# Patient Record
Sex: Male | Born: 1985 | Race: Black or African American | Hispanic: No | Marital: Single | Smoking: Current every day smoker
Health system: Southern US, Community
[De-identification: ages and names within clinical notes are randomized; demographics above are authoritative.]

## PROBLEM LIST (undated history)

## (undated) HISTORY — PX: MANDIBLE FRACTURE SURGERY: SHX706

---
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
## 2006-06-11 ENCOUNTER — Emergency Department (HOSPITAL_COMMUNITY): Admission: EM | Admit: 2006-06-11 | Discharge: 2006-06-11 | Payer: Self-pay | Admitting: Family Medicine

## 2007-07-08 ENCOUNTER — Emergency Department (HOSPITAL_COMMUNITY): Admission: EM | Admit: 2007-07-08 | Discharge: 2007-07-09 | Payer: Self-pay | Admitting: Emergency Medicine

## 2007-10-11 ENCOUNTER — Emergency Department (HOSPITAL_COMMUNITY): Admission: EM | Admit: 2007-10-11 | Discharge: 2007-10-11 | Payer: Self-pay | Admitting: Emergency Medicine

## 2007-10-23 ENCOUNTER — Emergency Department (HOSPITAL_COMMUNITY): Admission: EM | Admit: 2007-10-23 | Discharge: 2007-10-23 | Payer: Self-pay | Admitting: Emergency Medicine

## 2007-10-25 ENCOUNTER — Emergency Department (HOSPITAL_COMMUNITY): Admission: EM | Admit: 2007-10-25 | Discharge: 2007-10-26 | Payer: Self-pay | Admitting: Emergency Medicine

## 2007-10-26 ENCOUNTER — Emergency Department (HOSPITAL_COMMUNITY): Admission: EM | Admit: 2007-10-26 | Discharge: 2007-10-26 | Payer: Self-pay | Admitting: Emergency Medicine

## 2009-11-06 ENCOUNTER — Emergency Department (HOSPITAL_COMMUNITY): Admission: EM | Admit: 2009-11-06 | Discharge: 2009-11-06 | Payer: Self-pay | Admitting: Emergency Medicine

## 2009-12-06 ENCOUNTER — Ambulatory Visit (HOSPITAL_COMMUNITY): Admission: RE | Admit: 2009-12-06 | Discharge: 2009-12-06 | Payer: Self-pay | Admitting: Orthopaedic Surgery
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
## 2010-12-28 ENCOUNTER — Emergency Department (HOSPITAL_COMMUNITY): Payer: Self-pay

## 2010-12-28 ENCOUNTER — Emergency Department (HOSPITAL_COMMUNITY)
Admission: EM | Admit: 2010-12-28 | Discharge: 2010-12-28 | Disposition: A | Discharge status: Home / Self Care | Payer: Self-pay | Attending: Emergency Medicine | Admitting: Emergency Medicine

## 2010-12-28 DIAGNOSIS — M533 Sacrococcygeal disorders, not elsewhere classified: Secondary | ICD-10-CM | POA: Insufficient documentation

## 2010-12-28 DIAGNOSIS — S335XXA Sprain of ligaments of lumbar spine, initial encounter: Secondary | ICD-10-CM | POA: Insufficient documentation

## 2010-12-28 DIAGNOSIS — M546 Pain in thoracic spine: Secondary | ICD-10-CM | POA: Insufficient documentation

## 2010-12-28 DIAGNOSIS — M545 Low back pain, unspecified: Secondary | ICD-10-CM | POA: Insufficient documentation

## 2010-12-28 DIAGNOSIS — IMO0002 Reserved for concepts with insufficient information to code with codable children: Secondary | ICD-10-CM | POA: Insufficient documentation

## 2010-12-28 DIAGNOSIS — M542 Cervicalgia: Secondary | ICD-10-CM | POA: Insufficient documentation

## 2010-12-28 DIAGNOSIS — S139XXA Sprain of joints and ligaments of unspecified parts of neck, initial encounter: Secondary | ICD-10-CM | POA: Insufficient documentation

## 2010-12-28 DIAGNOSIS — W138XXA Fall from, out of or through other building or structure, initial encounter: Secondary | ICD-10-CM | POA: Insufficient documentation

## 2010-12-28 DIAGNOSIS — M25569 Pain in unspecified knee: Secondary | ICD-10-CM | POA: Insufficient documentation

## 2011-01-29 LAB — PROTIME-INR: INR: 1

## 2011-01-29 LAB — CBC
HCT: 43.7
Hemoglobin: 13.8
MCV: 72.3 — ABNORMAL LOW
Platelets: 245
WBC: 9

## 2011-01-29 LAB — POCT I-STAT CREATININE: Operator id: 285491

## 2011-01-29 LAB — I-STAT 8, (EC8 V) (CONVERTED LAB)
Acid-base deficit: 1
Chloride: 109
Glucose, Bld: 106 — ABNORMAL HIGH
Hemoglobin: 17
Operator id: 285491
Sodium: 142
TCO2: 26
pH, Ven: 7.356 — ABNORMAL HIGH

## 2011-02-01 LAB — BASIC METABOLIC PANEL
CO2: 27
Calcium: 9.7
Glucose, Bld: 89

## 2011-02-01 LAB — CBC
Hemoglobin: 14.5
MCHC: 32.2
RBC: 6.33 — ABNORMAL HIGH
RDW: 15.2

## 2011-02-01 LAB — PROTIME-INR: Prothrombin Time: 14

## 2011-02-01 LAB — D-DIMER, QUANTITATIVE: D-Dimer, Quant: <0.22

## 2014-10-02 ENCOUNTER — Encounter (HOSPITAL_COMMUNITY): Payer: Self-pay | Admitting: *Deleted

## 2014-10-02 ENCOUNTER — Emergency Department (INDEPENDENT_AMBULATORY_CARE_PROVIDER_SITE_OTHER)
Admission: EM | Admit: 2014-10-02 | Discharge: 2014-10-02 | Disposition: A | Discharge status: Home / Self Care | Payer: Self-pay | Source: Home / Self Care | Attending: Family Medicine | Admitting: Family Medicine

## 2014-10-02 DIAGNOSIS — M545 Low back pain, unspecified: Secondary | ICD-10-CM

## 2014-10-02 MED ORDER — IBUPROFEN 800 MG PO TABS: 800.0000 mg | ORAL_TABLET | Freq: Three times a day (TID) | ORAL | Status: DC

## 2014-10-02 MED ORDER — CYCLOBENZAPRINE HCL 5 MG PO TABS: 5.0000 mg | ORAL_TABLET | Freq: Three times a day (TID) | ORAL | Status: DC | PRN

## 2014-10-02 NOTE — ED Provider Notes (Signed)
CSN: [id number]     Arrival date & time 10/02/14  1044 History   First MD Initiated Contact with Patient 10/02/14 1253     Chief Complaint  Patient presents with  . Back Pain   (Consider location/radiation/quality/duration/timing/severity/associated sxs/prior Treatment) Patient is a 29 y.o. male presenting with back pain. The history is provided by the patient.  Back Pain Location:  Lumbar spine Quality:  Shooting and stiffness Radiates to:  Does not radiate Pain severity:  Mild Onset quality:  Gradual Duration:  48 months Progression:  Worsening Chronicity:  Recurrent Context comment:  Nki, just got out of prison , no abd ,gi or gu sx., no neuro sx.   History reviewed. No pertinent past medical history. Past Surgical History  Procedure Laterality Date  . Mandible fracture surgery     History reviewed. No pertinent family history. History  Substance Use Topics  . Smoking status: Current Every Day Smoker  . Smokeless tobacco: Not on file  . Alcohol Use: Yes    Review of Systems  Constitutional: Negative.   Gastrointestinal: Negative.   Genitourinary: Negative.   Musculoskeletal: Positive for back pain. Negative for myalgias, joint swelling and gait problem.  Skin: Negative.   Neurological: Negative.     Allergies  Review of patient's allergies indicates no known allergies.  Home Medications   Prior to Admission medications   Medication Sig Start Date End Date Taking? Authorizing Provider  cyclobenzaprine (FLEXERIL) 5 MG tablet Take 1 tablet (5 mg total) by mouth 3 (three) times daily as needed for muscle spasms. 10/02/14   Linna HoffJames D Emilyann Banka, MD  ibuprofen (ADVIL,MOTRIN) 800 MG tablet Take 1 tablet (800 mg total) by mouth 3 (three) times daily. 10/02/14   Linna HoffJames D Loreta Blouch, MD   BP 138/81 mmHg  Pulse 84  Temp(Src) 98.1 F (36.7 C) (Oral)  Resp 16  SpO2 100% Physical Exam  Constitutional: He is oriented to person, place, and time. He appears well-developed and  well-nourished.  Abdominal: Soft. Bowel sounds are normal. There is no tenderness.  Musculoskeletal: Normal range of motion. He exhibits tenderness.       Back:  Neurological: He is alert and oriented to person, place, and time.  Skin: Skin is warm and dry.  Nursing note and vitals reviewed.   ED Course  Procedures (including critical care time) Labs Review Labs Reviewed - No data to display  Imaging Review No results found.   MDM   1. Bilateral low back pain without sciatica        Linna HoffJames D Buren Havey, MD 10/02/14 1311

## 2014-10-02 NOTE — Discharge Instructions (Signed)
Consider mattress as cause of back pain issue, use medicine as needed.

## 2014-10-02 NOTE — ED Notes (Signed)
Pt    Reports     Back  Pain     Off  And on  For  Years   Worse  Over  The  Last  Several   Days     denys  Any  Recent  specefic  Injury        Denys  Any     Urinary  Symptoms       Sitting  Upright on  Exam table  Speaking in  Complete   sentances

## 2015-06-16 ENCOUNTER — Emergency Department (HOSPITAL_COMMUNITY)
Admission: EM | Admit: 2015-06-16 | Discharge: 2015-06-16 | Disposition: A | Discharge status: Home / Self Care | Payer: Self-pay | Attending: Emergency Medicine | Admitting: Emergency Medicine

## 2015-06-16 ENCOUNTER — Encounter (HOSPITAL_COMMUNITY): Payer: Self-pay | Admitting: Emergency Medicine

## 2015-06-16 DIAGNOSIS — R361 Hematospermia: Secondary | ICD-10-CM | POA: Insufficient documentation

## 2015-06-16 DIAGNOSIS — F17200 Nicotine dependence, unspecified, uncomplicated: Secondary | ICD-10-CM | POA: Insufficient documentation

## 2015-06-16 DIAGNOSIS — Z791 Long term (current) use of non-steroidal anti-inflammatories (NSAID): Secondary | ICD-10-CM | POA: Insufficient documentation

## 2015-06-16 LAB — URINALYSIS, ROUTINE W REFLEX MICROSCOPIC
BILIRUBIN URINE: NEGATIVE
GLUCOSE, UA: NEGATIVE mg/dL
HGB URINE DIPSTICK: NEGATIVE
KETONES UR: NEGATIVE mg/dL
Nitrite: NEGATIVE
PROTEIN: NEGATIVE mg/dL
Specific Gravity, Urine: 1.024 (ref 1.005–1.030)
pH: 6 (ref 5.0–8.0)

## 2015-06-16 LAB — URINE MICROSCOPIC-ADD ON: RBC / HPF: NONE SEEN RBC/hpf (ref 0–5)

## 2015-06-16 NOTE — ED Provider Notes (Signed)
CSN: [id number]     Arrival date & time 06/16/15  1620 History  By signing my name below, I, Phillip Jacobs, attest that this documentation has been prepared under the direction and in the presence of Phillip Vandekamp PA-C. Electronically Signed: Bethel Jacobs, ED Scribe. 06/16/2015 5:13 PM   No chief complaint on file.   The history is provided by the patient. No language interpreter was used.   Phillip Jacobs is a 30 y.o. male who presents to the Emergency Department complaining of new penile bleeding after ejaculation with onset 1 week ago. Pt notes 3-4 episodes of small amounts of thick red blood from the penis after ejaculation. Pt denies fever, chills, painful ejaculation, hematuria, dysuria, penile discharge, testicular pain, painful bowel movement, abdominal pain, nausea, and vomiting. He denies trauma to the penis. Denies inserting foreign objects into the penis. Denies concern for STDs. Denies history of urological disease. He has no PCP.   History reviewed. No pertinent past medical history. Past Surgical History  Procedure Laterality Date  . Mandible fracture surgery     No family history on file. Social History  Substance Use Topics  . Smoking status: Current Every Day Smoker  . Smokeless tobacco: None  . Alcohol Use: Yes    Review of Systems  Gastrointestinal: Negative for nausea, vomiting, abdominal pain, diarrhea, constipation and blood in stool.  Genitourinary: Negative for dysuria, hematuria, flank pain, discharge, scrotal swelling, difficulty urinating, penile pain and testicular pain.       Blood in semen  All other systems reviewed and are negative.   Allergies  Review of patient's allergies indicates no known allergies.  Home Medications   Prior to Admission medications   Medication Sig Start Date End Date Taking? Authorizing Provider  cyclobenzaprine (FLEXERIL) 5 MG tablet Take 1 tablet (5 mg total) by mouth 3 (three) times daily as needed for muscle  spasms. 10/02/14   Linna Hoff, MD  ibuprofen (ADVIL,MOTRIN) 800 MG tablet Take 1 tablet (800 mg total) by mouth 3 (three) times daily. 10/02/14   Linna Hoff, MD   BP 118/69 mmHg  Pulse 82  Temp(Src) 97.6 F (36.4 C) (Oral)  Resp 16  Ht  (1.803 m)  Wt 230 lb (104.327 kg)  BMI 32.09 kg/m2  SpO2 100% Physical Exam  Constitutional: He appears well-developed and well-nourished. No distress.  Nontoxic appearing  HENT:  Head: Normocephalic and atraumatic.  Right Ear: External ear normal.  Left Ear: External ear normal.  Eyes: Conjunctivae are normal. Right eye exhibits no discharge. Left eye exhibits no discharge. No scleral icterus.  Neck: Normal range of motion.  Cardiovascular: Normal rate.   Pulmonary/Chest: Effort normal.  Abdominal: Soft. He exhibits no distension. There is no tenderness. There is no rebound and no guarding.  Genitourinary: Rectum normal, prostate normal, testes normal and penis normal. Rectal exam shows no tenderness. Prostate is not enlarged and not tender. Right testis shows no swelling and no tenderness. Left testis shows no swelling and no tenderness. Circumcised. No penile tenderness. No discharge found.  GU exam chaperoned by Lewie Loron, RN  Normal external male genitalia. No lesions noted. No inguinal adenopathy. No tenderness over penis. No testicular tenderness, swelling or induration. No pain over epididymus. Normal rectal exam. Prostate is not enlarged, boggy, warm or TTP.   Musculoskeletal: Normal range of motion.  Moves all extremities spontaneously  Lymphadenopathy:       Right: No inguinal adenopathy present.       Left:  No inguinal adenopathy present.  Neurological: He is alert. Coordination normal.  Skin: Skin is warm and dry. He is not diaphoretic.  Psychiatric: He has a normal mood and affect. His behavior is normal.  Nursing note and vitals reviewed.   ED Course  Procedures (including critical care time) DIAGNOSTIC STUDIES: Oxygen  Saturation is 100% on RA,  normal by my interpretation.    COORDINATION OF CARE: 5:11 PM Discussed treatment plan which includes UA with pt at bedside and pt agreed to plan.  Labs Review Labs Reviewed  URINALYSIS, ROUTINE W REFLEX MICROSCOPIC (NOT AT [facility]) - Abnormal; Notable for the following:    Leukocytes, UA MODERATE (*)    All other components within normal limits  URINE MICROSCOPIC-ADD ON - Abnormal; Notable for the following:    Squamous Epithelial / LPF 0-5 (*)    Bacteria, UA FEW (*)    All other components within normal limits    Imaging Review No results found. I have personally reviewed and evaluated these lab results as part of my medical decision-making.   EKG Interpretation None      MDM   Final diagnoses:  Blood in semen   30 year old male presenting with blood in his semen 1 week. Denies all other associated symptoms. Vital signs stable. Patient is nontoxic-appearing. Abdomen soft, nontender. Normal male external genitalia. No penile discharge or blood noted. No testicular tenderness, swelling or induration. No prostatic enlargement, warmth or tenderness. Normal rectal exam. Patient's urinalysis positive for moderate leukocytes and few bacteria. As patient is asymptomatic, I do not feel that treatment with antibiotics is indicated at this time. Patient will be referred to urology for outpatient follow-up. Given urology information for Dr. Ronne Jacobs in discharge paperwork. Discussed patient with attending Dr. Clydene Jacobs who agrees with the above plan. Return precautions given in discharge paperwork and discussed with pt at bedside. Pt stable for discharge   I personally performed the services described in this documentation, which was scribed in my presence. The recorded information has been reviewed and is accurate.    Alveta Heimlich, PA-C 06/16/15 1857  Lyndal Pulley, MD 06/17/15 [phone]

## 2015-06-16 NOTE — ED Notes (Signed)
Pt left at this time with all belongings.  

## 2015-06-16 NOTE — ED Notes (Signed)
Witnessed rectal exam by [employer] PA

## 2015-06-16 NOTE — Discharge Instructions (Signed)
Abstain from intercourse for the next week. Call and schedule a follow up appointment with urology, Dr. Ronne Binning. Return to ED with fevers, chills, abdominal pain, rectal pain, burning on urination, penile discharge or any other new, worsening or concerning symptoms.

## 2015-06-16 NOTE — ED Notes (Signed)
Pt reports blood after ejaculation starting yesterday. States small amount, possible clots. Four episodes since yesterday. Denies discharge, burning, other s/s of STDs. Denies pain.

## 2015-08-11 ENCOUNTER — Emergency Department (HOSPITAL_COMMUNITY)
Admission: EM | Admit: 2015-08-11 | Discharge: 2015-08-11 | Disposition: A | Discharge status: Home / Self Care | Payer: Self-pay | Attending: Emergency Medicine | Admitting: Emergency Medicine

## 2015-08-11 ENCOUNTER — Encounter (HOSPITAL_COMMUNITY): Payer: Self-pay | Admitting: *Deleted

## 2015-08-11 DIAGNOSIS — R590 Localized enlarged lymph nodes: Secondary | ICD-10-CM | POA: Insufficient documentation

## 2015-08-11 DIAGNOSIS — F17210 Nicotine dependence, cigarettes, uncomplicated: Secondary | ICD-10-CM | POA: Insufficient documentation

## 2015-08-11 DIAGNOSIS — H00033 Abscess of eyelid right eye, unspecified eyelid: Secondary | ICD-10-CM

## 2015-08-11 DIAGNOSIS — L03213 Periorbital cellulitis: Secondary | ICD-10-CM | POA: Insufficient documentation

## 2015-08-11 DIAGNOSIS — J340 Abscess, furuncle and carbuncle of nose: Secondary | ICD-10-CM | POA: Insufficient documentation

## 2015-08-11 DIAGNOSIS — Z791 Long term (current) use of non-steroidal anti-inflammatories (NSAID): Secondary | ICD-10-CM | POA: Insufficient documentation

## 2015-08-11 MED ORDER — TRAMADOL HCL 50 MG PO TABS: 50.0000 mg | ORAL_TABLET | Freq: Four times a day (QID) | ORAL | Status: DC | PRN

## 2015-08-11 MED ORDER — SULFAMETHOXAZOLE-TRIMETHOPRIM 800-160 MG PO TABS: 1.0000 | ORAL_TABLET | Freq: Two times a day (BID) | ORAL | Status: AC

## 2015-08-11 MED ORDER — MUPIROCIN CALCIUM 2 % NA OINT: TOPICAL_OINTMENT | NASAL | Status: AC

## 2015-08-11 MED ORDER — LIDOCAINE HCL 2 % IJ SOLN
10.0000 mL | Freq: Once | INTRAMUSCULAR | Status: AC
  Administered 2015-08-11: 10 mL via INTRADERMAL
  Filled 2015-08-11: qty 20

## 2015-08-11 NOTE — ED Notes (Signed)
Started 2 days ago with "sore" to right side of nose. Woke this am with right facial swelling and pain.

## 2015-08-11 NOTE — ED Provider Notes (Signed)
CSN: [id number]     Arrival date & time 08/11/15  1231 History  By signing my name below, I, Ronney LionSuzanne Le, attest that this documentation has been prepared under the direction and in the presence of Fayrene HelperBowie Manya Balash, PA-C. Electronically Signed: Ronney LionSuzanne Le, ED Scribe. 08/11/2015. 2:40 PM.    Chief Complaint  Patient presents with  . Facial Swelling   The history is provided by the patient. No language interpreter was used.    HPI Comments: Phillip Jacobs is a 30 y.o. male who presents to the Emergency Department complaining of gradual-onset, constant, gradually worsening, right-sided facial swelling next to the right side of his nose. He notes associated 8/10 pain to the area. Patient had applied ice to the area and taken Benadryl with no relief.  He denies fever or pain with eye movement. Patient states he is UTD with his tetanus vaccinations.  History reviewed. No pertinent past medical history. Past Surgical History  Procedure Laterality Date  . Mandible fracture surgery     No family history on file. Social History  Substance Use Topics  . Smoking status: Current Every Day Smoker -- 1.50 packs/day    Types: Cigarettes  . Smokeless tobacco: None  . Alcohol Use: 2.4 oz/week    4 Shots of liquor per week    Review of Systems  Constitutional: Negative for fever.  HENT: Positive for facial swelling.     Allergies  Review of patient's allergies indicates no known allergies.  Home Medications   Prior to Admission medications   Medication Sig Start Date End Date Taking? Authorizing Provider  cyclobenzaprine (FLEXERIL) 5 MG tablet Take 1 tablet (5 mg total) by mouth 3 (three) times daily as needed for muscle spasms. 10/02/14   Linna HoffJames D Kindl, MD  ibuprofen (ADVIL,MOTRIN) 800 MG tablet Take 1 tablet (800 mg total) by mouth 3 (three) times daily. 10/02/14   Linna HoffJames D Kindl, MD   BP 112/92 mmHg  Pulse 93  Temp(Src) 98.2 F (36.8 C) (Oral)  Resp 18  Ht 5\' 11"  (1.803 m)  Wt 230 lb  (104.327 kg)  BMI 32.09 kg/m2  SpO2 98% Physical Exam  Constitutional: He is oriented to person, place, and time. He appears well-developed and well-nourished. No distress.  HENT:  Head: Normocephalic and atraumatic.  Right Ear: Hearing, tympanic membrane, external ear and ear canal normal.  Left Ear: Hearing, tympanic membrane, external ear and ear canal normal.  Right nare: Moderate amount of swelling noted to lateral aspect. TTP.  Surrounding skin edema noted to right side of face and following right-sided zygomatic arch.  TMs are normal.   Eyes: Conjunctivae and EOM are normal. Pupils are equal, round, and reactive to light.  Upper and lower eyelids not swollen. PERRL. EOMI.   Neck: Neck supple. No tracheal deviation present.  Right anterior cervical lymphadenopathy noted.   Cardiovascular: Normal rate.   Pulmonary/Chest: Effort normal. No respiratory distress.  Musculoskeletal: Normal range of motion.  Lymphadenopathy:    He has cervical adenopathy.  Neurological: He is alert and oriented to person, place, and time.  Skin: Skin is warm and dry.  Psychiatric: He has a normal mood and affect. His behavior is normal.  Nursing note and vitals reviewed.   ED Course  Procedures (including critical care time)  DIAGNOSTIC STUDIES: Oxygen Saturation is 98% on RA, normal by my interpretation.    COORDINATION OF CARE: 1:56 PM - Discussed treatment plan with pt at bedside which includes I&D of area. Pt verbalized  understanding and agreed to plan.   INCISION AND DRAINAGE PROCEDURE NOTE: Patient identification was confirmed and verbal consent was obtained. This procedure was performed by Fayrene Helper, PA-C, at 2:18 PM. Site: Right-sided face Sterile procedures observed Needle size: 18 Anesthetic used (type and amt): Lidocaine 2% w/o epi, 1 mL Blade size: 11 Drainage: Minimal discharge Complexity: Complex Site anesthetized, incision made over site, wound drained and  explored loculations, rinsed with copious amounts of normal saline, wound packed with sterile gauze, covered with dry, sterile dressing.  Pt tolerated procedure well without complications.  Instructions for care discussed verbally and pt provided with additional written instructions for homecare and f/u.    MDM   Final diagnoses:  None   Patient with skin abscess amenable to incision and drainage. Abscess inside R nare.  evidence of preseptal cellulitis without orbital cellulitis.  Encouraged home warm soaks and flushing.  Mild signs of cellulitis is surrounding skin.  Will d/c to home with Bactrim, Ultram, and Bactroban. F/u in 48 hrs  BP 112/92 mmHg  Pulse 93  Temp(Src) 98.2 F (36.8 C) (Oral)  Resp 18  Ht  (1.803 m)  Wt 104.327 kg  BMI 32.09 kg/m2  SpO2 98%  I personally performed the services described in this documentation, which was scribed in my presence. The recorded information has been reviewed and is accurate.       Fayrene Helper, PA-C 08/11/15 1443  Benjiman Core, MD 08/12/15 [phone]
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
## 2015-08-11 NOTE — Discharge Instructions (Signed)
Apply mupirocin cream inside your nose on both side daily for 1-2 weeks.  Take antibiotic as prescribed.  Apply warm compress to right side of face several times a day.  Return in 48 hrs for wound recheck.   Preseptal Cellulitis, Adult Preseptal cellulitis--also called periorbital cellulitis--is an infection that can affect your eyelid and the soft tissues or skin that surround your eye. The infection may also affect the structures that produce and drain your tears. It does not affect your eye itself. CAUSES This condition may be caused by:  Bacterial infection.  Long-term (chronic) sinus infections.  An object (foreign body) that is stuck behind the eye.  An injury that:  Goes through the eyelid tissues.  Causes an infection, such as an insect sting.  Fracture of the bone around the eye.  Infections that have spread from the eyelid or other structures around the eye.  Bite wounds.  Inflammation or infection of the lining membranes of the brain (meningitis).  An infection in the blood (septicemia).  Dental infection (abscess).  Viral infection. This is rare. RISK FACTORS Risk factors for preseptal cellulitis include:  Participating in activities that increase your risk of trauma to the face or head, such as boxing or high-speed activities.  Having a weakened defense system (immune system).  Medical conditions, such as nasal polyps, that increase your risk for frequent or recurrent sinus infections.  Not receiving regular dental care. SYMPTOMS Symptoms of this condition usually come on suddenly. Symptoms may include:  Red, hot, and swollen eyelids.  Fever.  Difficulty opening your eye.  Eye pain. DIAGNOSIS This condition may be diagnosed by an eye exam. You may also have tests, such as:  Blood tests.  CT scan.  MRI.  Spinal tap (lumbar puncture). This is a procedure that involves removing and examining a small amount of the fluid that surrounds the brain  and spinal cord. This checks for meningitis. TREATMENT Treatment for this condition will include antibiotic medicines. These may be given by mouth (orally), through an IV, or as a shot. Your health care provider may also recommend nasal decongestants to reduce swelling. HOME CARE INSTRUCTIONS  Take your antibiotic medicine as directed by your health care provider. Finish all of it even if you start to feel better.  Take medicines only as directed by your health care provider.  Drink enough fluid to keep your urine clear or pale yellow.  Do not use any tobacco products, including cigarettes, chewing tobacco, or electronic cigarettes. If you need help quitting, ask your health care provider.  Keep all follow-up visits as directed by your health care provider. These include any visits with an eye specialist (ophthalmologist) or dentist. SEEK MEDICAL CARE IF:  You have a fever.  Your eyelids become more red, warm, or swollen.  You have new symptoms.  Your symptoms do not get better with treatment. SEEK IMMEDIATE MEDICAL CARE IF:  You develop double vision, or your vision becomes blurred or worsens in any way.  You have trouble moving your eyes.  Your eye looks like it is sticking out or bulging out (proptosis).  You develop a severe headache, severe neck pain, or neck stiffness.  You develop repeated vomiting.   This information is not intended to replace advice given to you by your health care provider. Make sure you discuss any questions you have with your health care provider.   Document Released: 05/26/2010 Document Revised: 09/07/2014 Document Reviewed: 04/19/2014 Elsevier Interactive Patient Education [employer]2016 [employer]

## 2015-08-13 ENCOUNTER — Inpatient Hospital Stay (HOSPITAL_COMMUNITY)
Admission: EM | Admit: 2015-08-13 | Discharge: 2015-08-14 | DRG: 603 | Disposition: A | Discharge status: Home / Self Care | Payer: Self-pay | Attending: Internal Medicine | Admitting: Internal Medicine

## 2015-08-13 ENCOUNTER — Emergency Department (HOSPITAL_COMMUNITY): Payer: Self-pay

## 2015-08-13 ENCOUNTER — Encounter (HOSPITAL_COMMUNITY): Payer: Self-pay | Admitting: *Deleted

## 2015-08-13 DIAGNOSIS — J340 Abscess, furuncle and carbuncle of nose: Secondary | ICD-10-CM

## 2015-08-13 DIAGNOSIS — Z79899 Other long term (current) drug therapy: Secondary | ICD-10-CM

## 2015-08-13 DIAGNOSIS — F17200 Nicotine dependence, unspecified, uncomplicated: Secondary | ICD-10-CM | POA: Diagnosis present

## 2015-08-13 DIAGNOSIS — R599 Enlarged lymph nodes, unspecified: Secondary | ICD-10-CM | POA: Diagnosis present

## 2015-08-13 DIAGNOSIS — L03211 Cellulitis of face: Principal | ICD-10-CM

## 2015-08-13 DIAGNOSIS — F17210 Nicotine dependence, cigarettes, uncomplicated: Secondary | ICD-10-CM | POA: Diagnosis present

## 2015-08-13 LAB — I-STAT CHEM 8, ED
BUN: 8 mg/dL (ref 6–20)
Calcium, Ion: 1.16 mmol/L (ref 1.12–1.23)
Chloride: 99 mmol/L — ABNORMAL LOW (ref 101–111)
Creatinine, Ser: 1.4 mg/dL — ABNORMAL HIGH (ref 0.61–1.24)
Glucose, Bld: 81 mg/dL (ref 65–99)
HCT: 45 % (ref 39.0–52.0)
Hemoglobin: 15.3 g/dL (ref 13.0–17.0)
Potassium: 4.1 mmol/L (ref 3.5–5.1)
Sodium: 138 mmol/L (ref 135–145)
TCO2: 27 mmol/L (ref 0–100)

## 2015-08-13 LAB — CBC WITH DIFFERENTIAL/PLATELET
Basophils Absolute: 0 K/uL (ref 0.0–0.1)
Basophils Relative: 0 %
Eosinophils Absolute: 0 K/uL (ref 0.0–0.7)
Eosinophils Relative: 0 %
HCT: 39.4 % (ref 39.0–52.0)
Hemoglobin: 12.6 g/dL — ABNORMAL LOW (ref 13.0–17.0)
Lymphocytes Relative: 20 %
Lymphs Abs: 1.8 K/uL (ref 0.7–4.0)
MCH: 21.8 pg — ABNORMAL LOW (ref 26.0–34.0)
MCHC: 32 g/dL (ref 30.0–36.0)
MCV: 68.3 fL — ABNORMAL LOW (ref 78.0–100.0)
Monocytes Absolute: 0.8 K/uL (ref 0.1–1.0)
Monocytes Relative: 9 %
Neutro Abs: 6.4 K/uL (ref 1.7–7.7)
Neutrophils Relative %: 71 %
Platelets: 250 K/uL (ref 150–400)
RBC: 5.77 MIL/uL (ref 4.22–5.81)
RDW: 15.3 % (ref 11.5–15.5)
WBC: 9 K/uL (ref 4.0–10.5)

## 2015-08-13 MED ORDER — HYDROMORPHONE HCL 1 MG/ML IJ SOLN
1.0000 mg | Freq: Once | INTRAMUSCULAR | Status: AC
  Administered 2015-08-13: 1 mg via INTRAVENOUS
  Filled 2015-08-13: qty 1

## 2015-08-13 MED ORDER — MORPHINE SULFATE (PF) 4 MG/ML IV SOLN
4.0000 mg | Freq: Once | INTRAVENOUS | Status: AC
  Administered 2015-08-13: 4 mg via INTRAVENOUS
  Filled 2015-08-13: qty 1

## 2015-08-13 MED ORDER — ONDANSETRON HCL 4 MG/2ML IJ SOLN
4.0000 mg | Freq: Once | INTRAMUSCULAR | Status: AC
  Administered 2015-08-13: 4 mg via INTRAVENOUS
  Filled 2015-08-13: qty 2

## 2015-08-13 MED ORDER — IOPAMIDOL (ISOVUE-300) INJECTION 61%
75.0000 mL | Freq: Once | INTRAVENOUS | Status: AC | PRN
  Administered 2015-08-13: 100 mL via INTRAVENOUS

## 2015-08-13 MED ORDER — LIDOCAINE HCL (PF) 1 % IJ SOLN: 5.0000 mL | Freq: Once | INTRAMUSCULAR | Status: DC

## 2015-08-13 MED ORDER — NICOTINE 21 MG/24HR TD PT24
21.0000 mg | MEDICATED_PATCH | Freq: Every day | TRANSDERMAL | Status: DC
  Administered 2015-08-13: 21 mg via TRANSDERMAL
  Filled 2015-08-13: qty 1

## 2015-08-13 MED ORDER — KETOROLAC TROMETHAMINE 30 MG/ML IJ SOLN
30.0000 mg | Freq: Once | INTRAMUSCULAR | Status: AC
  Administered 2015-08-13: 30 mg via INTRAVENOUS
  Filled 2015-08-13: qty 1

## 2015-08-13 MED ORDER — LIDOCAINE HCL (PF) 1 % IJ SOLN
5.0000 mL | Freq: Once | INTRAMUSCULAR | Status: AC
  Administered 2015-08-13: 5 mL
  Filled 2015-08-13: qty 5

## 2015-08-13 MED ORDER — CLINDAMYCIN PHOSPHATE 600 MG/50ML IV SOLN
600.0000 mg | Freq: Once | INTRAVENOUS | Status: AC
  Administered 2015-08-13: 600 mg via INTRAVENOUS
  Filled 2015-08-13: qty 50

## 2015-08-13 NOTE — ED Provider Notes (Signed)
CSN: [id number]     Arrival date & time 08/13/15  1644 History  By signing my name below, I, Phillip Jacobs, attest that this documentation has been prepared under the direction and in the presence of Diondre Pulis, PA-C. Electronically Signed: Ronney Jacobs, ED Scribe. 08/13/2015. 12:14 AM.    Chief Complaint  Patient presents with  . Facial Pain   The history is provided by the patient. No language interpreter was used.    HPI Comments: Phillip Jacobs is a 30 y.o. male who presents to the Emergency Department complaining of a gradual-onset, constant, worsening, severe area of pain and swelling in his right nostril, radiating to his right-sided face, that began 4 days ago. Patient was seen at the ED 2 days ago for the same symptoms, but he states the pain and swelling has worsened since, and he has also developed additional associated symptoms, including pain with closing his eye, photophobia, chills, diaphoresis, and loss of appetite. Patient had an I&D procedure done in the ED 2 days ago and was given Ultram, Bactrim, and Bactroban, but he states that he seen no improvement despite having the procedure done and taking all medications as prescribed.   History reviewed. No pertinent past medical history. Past Surgical History  Procedure Laterality Date  . Mandible fracture surgery     No family history on file. Social History  Substance Use Topics  . Smoking status: Current Every Day Smoker -- 1.50 packs/day    Types: Cigarettes  . Smokeless tobacco: None  . Alcohol Use: 2.4 oz/week    4 Shots of liquor per week    Review of Systems  Constitutional: Positive for chills, diaphoresis and appetite change.  HENT:       Positive for area of swelling and pain in right nostril, radiating to right-sided face.  Eyes: Positive for photophobia and pain (with closing eyes).    Allergies  Review of patient's allergies indicates no known allergies.  Home Medications   Prior to Admission  medications   Medication Sig Start Date End Date Taking? Authorizing Provider  cyclobenzaprine (FLEXERIL) 5 MG tablet Take 1 tablet (5 mg total) by mouth 3 (three) times daily as needed for muscle spasms. 10/02/14   Linna Hoff, MD  ibuprofen (ADVIL,MOTRIN) 800 MG tablet Take 1 tablet (800 mg total) by mouth 3 (three) times daily. 10/02/14   Linna Hoff, MD  mupirocin nasal ointment (BACTROBAN) 2 % Apply in each nostril daily x 1 week 08/11/15   Fayrene Helper, PA-C  sulfamethoxazole-trimethoprim (BACTRIM DS,SEPTRA DS) 800-160 MG tablet Take 1 tablet by mouth 2 (two) times daily. 08/11/15 08/18/15  Fayrene Helper, PA-C  traMADol (ULTRAM) 50 MG tablet Take 1 tablet (50 mg total) by mouth every 6 (six) hours as needed. 08/11/15   Fayrene Helper, PA-C   BP 117/67 mmHg  Pulse 103  Temp(Src) 98 F (36.7 C)  Resp 18  Ht  (1.803 m)  Wt 230 lb (104.327 kg)  BMI 32.09 kg/m2  SpO2 100% Physical Exam  Constitutional: He is oriented to person, place, and time. He appears well-developed and well-nourished. No distress.  HENT:  Head: Normocephalic and atraumatic.  Mouth/Throat: Oropharynx is clear and moist. No oropharyngeal exudate.  Large abscess in right nare, blocking entire nasal passage, with obvious purulent drainage. Swelling over right maxillary sinus and infraorbital area, with overlying erythema and significant tenderness to palpation. Right periorbital edema present.   Eyes: Conjunctivae and EOM are normal. Pupils are equal, round,  and reactive to light. Right eye exhibits no discharge. Left eye exhibits no discharge. No scleral icterus.  Neck: Neck supple. No tracheal deviation present.  Right anterior cervical lymphadenopathy.  Cardiovascular: Normal rate.   Pulmonary/Chest: Effort normal. No respiratory distress.  Musculoskeletal: Normal range of motion.  Lymphadenopathy:    He has cervical adenopathy.  Neurological: He is alert and oriented to person, place, and time.  Skin: Skin is warm and  dry.  Psychiatric: He has a normal mood and affect. His behavior is normal.  Nursing note and vitals reviewed.   ED Course  Procedures (including critical care time)  DIAGNOSTIC STUDIES: Oxygen Saturation is 100% on RA, normal by my interpretation.    COORDINATION OF CARE: 6:34 PM - Discussed treatment plan with pt at bedside which includes IV pain medication and antibiotics, and CT face to rule out any possible abscess in patient's maxillary sinuses. If CT results are unremarkable, will repeat I&D procedure. If positive for abscess in maxillary sinus, will consult with ENT for treatment plan. Pt verbalized understanding and agreed to plan.   Labs Review Labs Reviewed  CBC WITH DIFFERENTIAL/PLATELET - Abnormal; Notable for the following:    Hemoglobin 12.6 (*)    MCV 68.3 (*)    MCH 21.8 (*)    All other components within normal limits  I-STAT CHEM 8, ED - Abnormal; Notable for the following:    Chloride 99 (*)    Creatinine, Ser 1.40 (*)    All other components within normal limits    Imaging Review Ct Maxillofacial W/cm  08/13/2015  CLINICAL DATA:  Abscess of the right nare EXAM: CT MAXILLOFACIAL WITH CONTRAST TECHNIQUE: Multidetector CT imaging of the maxillofacial structures was performed with intravenous contrast. Multiplanar CT image reconstructions were also generated. A small metallic BB was placed on the right temple in order to reliably differentiate right from left. CONTRAST:  100mL ISOVUE-300 IOPAMIDOL (ISOVUE-300) INJECTION 61% COMPARISON:  12/28/2010 FINDINGS: The paranasal sinuses are clear. Diffuse skin thickening and subcutaneous fat stranding along the right side of face noted compatible with cellulitis. More focal, asymmetric skin thickening involves the right nostril. Within this area of skin thickening there is focal low-attenuation compatible with a small abscess. This measures 6 x 5 x 8 mm. No bone erosions identified to suggest osteomyelitis. There has been  previous screw and plate fixation of the right side of mandible, image 18 of series 3. The visualized intracranial contents are unremarkable. IMPRESSION: 1. Right facial cellulitis 2. Asymmetric skin thickening overlying the right nostril with central low attenuation compatible with small soft tissue abscess and/or phlegmon phlegmon. Electronically Signed   By: Signa Kellaylor  Stroud M.D.   On: 08/13/2015 21:16   I have personally reviewed and evaluated these images and lab results as part of my medical decision-making.  9:31 PM - Attending physician Dr. Estell HarpinZammit in exam room to evaluate pt. He recommends hospital admission. Discussed CT results and treatment plan with pt, who verbalized understanding and agreed to plan.   INCISION AND DRAINAGE PROCEDURE NOTE: Patient identification was confirmed and verbal consent was obtained. This procedure was performed by Gaylyn RongSamantha Richrd Kuzniar, PA-C, at 10:56 PM. Site: Right nare Sterile procedures observed Needle size: 18 Anesthetic used (type and amt): Lidocaine 1% without epi, 3 mL Drainage was aspirated with 18-gauge needle. Drainage: Copious  Complexity: Complex Site anesthetized, wound aspirated, drained, and explored loculations, rinsed with copious amounts of normal saline, wound covered with dry, sterile dressing.  Pt tolerated procedure well without complications.  Instructions for care discussed verbally and pt provided with additional written instructions for homecare and f/u.   MDM   Final diagnoses:  Facial cellulitis  Abscess of nasal cavity   30 year old male presents to the ED complaining of right-sided facial swelling, abscess in right near. Patient was seen 2 days ago for similar symptoms and had his abscess incised and drained. He was placed on antibiotics, Bactrim which she has been taking. However, his symptoms have worsened and the swelling has gotten much worse. Patient's face is erythematous, warm to the touch. There is obvious abscess  inside of the right near status. Thomas occluding his nasal passage. Significant TTP over right maxillary sinus. Concern for spreading of abscess. We'll obtain CT maxillofacial to rule out. No leukocytosis present. Creatinine mildly elevated at 1.4. Patient given fluids, pain medication, clindamycin. CT reveals right facial cellulitis. Small soft tissue abscess in right naris. Given that patient has failed outpatient antibiotic therapy for cellulitis. Recommend admission for continued IV antibiotics. Abscess wasn't amenable to incision and drainage, performed by me. I spoke with hospitalist who will admit patient to their service. Pt is hemodynamically stable and awaiting bed placement.  I personally performed the services described in this documentation, which was scribed in my presence. The recorded information has been reviewed and is accurate. Patient was discussed with and seen by Dr. Estell Harpin who agrees with the treatment plan.      12:58 AM Pt transport came to retrieve pt to bring him to his admitted bed and were unable to locate pt. All pt belongings not in room. [employer] and security currently attempting to locate pt as he has IV in right AC. I notified admitting physician.     Dub Mikes, PA-C 08/14/15 0033  Dub Mikes, PA-C 08/14/15 7829  Bethann Berkshire, MD 08/14/15 [phone]

## 2015-08-13 NOTE — ED Notes (Signed)
Pt st's he was here 2 days ago for abscess in right nostril.  Pt st's they drained it but area feels full again.  Also st's pain has increased since he was here.

## 2015-08-13 NOTE — ED Notes (Signed)
The pt was seen here on the 4th for an abscess of his rt nose.  He reports that it is not getting better and he is having more pain.  Unknown temp

## 2015-08-13 NOTE — ED Notes (Signed)
Some redness and swelling to his rt face

## 2015-08-13 NOTE — ED Provider Notes (Signed)
Angiocath insertion Performed by: Anselm PancoastShawn C Tayveon Lombardo  Consent: Verbal consent obtained. Risks and benefits: risks, benefits and alternatives were discussed Time out: Immediately prior to procedure a "time out" was called to verify the correct patient, procedure, equipment, support staff and site/side marked as required.  Preparation: Patient was prepped and draped in the usual sterile fashion.  Vein Location: Right antecubital  Gauge: 20-gauge  Normal blood return and flush without difficulty Patient tolerance: Patient tolerated the procedure well with no immediate complications.    Anselm PancoastShawn C Kennady Zimmerle, PA-C 08/13/15 1848  Gwyneth SproutWhitney Plunkett, MD 08/13/15 2129

## 2015-08-14 ENCOUNTER — Encounter (HOSPITAL_COMMUNITY): Payer: Self-pay | Admitting: Internal Medicine

## 2015-08-14 DIAGNOSIS — L03211 Cellulitis of face: Principal | ICD-10-CM

## 2015-08-14 DIAGNOSIS — F17200 Nicotine dependence, unspecified, uncomplicated: Secondary | ICD-10-CM | POA: Diagnosis present

## 2015-08-14 DIAGNOSIS — J340 Abscess, furuncle and carbuncle of nose: Secondary | ICD-10-CM | POA: Diagnosis present

## 2015-08-14 MED ORDER — VANCOMYCIN HCL IN DEXTROSE 1-5 GM/200ML-% IV SOLN: 1000.0000 mg | Freq: Three times a day (TID) | INTRAVENOUS | Status: DC

## 2015-08-14 NOTE — ED Notes (Signed)
Was notified by [employer] that they went to pt's home address but was unable to locate pt.  [employer] security guards notified and will continue to locate pt outside of hosp.

## 2015-08-14 NOTE — ED Notes (Signed)
When attempting to take pt upstairs pt not in room, all pt's belongings are not in room.  Pt has IV in right AC that has not been removed.  Charge Nurse Onalee Huaavid, RN made aware.  Attempted to locate pt outside of ED without success.  [employer] notified and will attempt to locate pt outside of hospital.  Admitting nurse on 6N  Thess, RN made aware that pt will not be coming to floor at this time

## 2015-08-14 NOTE — H&P (Signed)
[facility] History and Physical  Phillip Jacobs UJW:[id number] DOB: 27-Oct-1985 DOA: 08/13/2015  Referring physician: Dub Mikes, PA-C PCP: No PCP Per Patient   Chief Complaint: Facial swelling and pain.  HPI: Phillip Jacobs is a 30 y.o. male with no previous past medical history who returns to the emergency department 2 days after being seen for similar symptoms which did not respond to oral antibiotics.  Per patient, about 5 days ago he started developing pain on his right nostril. Subsequently he developed an abscess at the entrance of the right nostril which caused significant pain and obstruction of airflow. This subsequently worsened to the point where the patient has developed significant left facial swelling, left anterior cervical chain adenopathy is, palpebral and periorbital swelling, right eye photophobia and significant tenderness. These symptoms have not been resolved by oral Bactrim.   Review of Systems:  Constitutional:  Positive chills No weight loss, night sweats, Fevers, fatigue.  HEENT:  Right facial edema, erythema and right nostril abscess. No headaches, Difficulty swallowing,Tooth/dental problems,Sore throat,  No sneezing, itching, ear ache, nasal congestion, post nasal drip,  Cardio-vascular:  No chest pain, Orthopnea, PND, swelling in lower extremities, anasarca, dizziness, palpitations  GI:  No heartburn, indigestion, abdominal pain, nausea, vomiting, diarrhea, change in bowel habits, loss of appetite  Resp:  No shortness of breath with exertion or at rest. No excess mucus, no productive cough, No non-productive cough, No coughing up of blood.No change in color of mucus.No wheezing.No chest wall deformity  Skin:  Right nostril abscess and right-sided facial erythema. GU:  no dysuria, change in color of urine, no urgency or frequency. No flank pain.  Musculoskeletal:  No joint pain or swelling. No decreased range of motion. No back  pain.  Psych:  No change in mood or affect. No depression or anxiety. No memory loss.   History reviewed. No pertinent past medical history. Past Surgical History  Procedure Laterality Date  . Mandible fracture surgery     Social History:  reports that he has been smoking Cigarettes.  He has been smoking about 1.50 packs per day. He does not have any smokeless tobacco history on file. He reports that he drinks about 2.4 oz of alcohol per week. He reports that he uses illicit drugs (Marijuana).  No Known Allergies  History reviewed. No pertinent family history.   Prior to Admission medications   Medication Sig Start Date End Date Taking? Authorizing Provider  mupirocin nasal ointment (BACTROBAN) 2 % Apply in each nostril daily x 1 week 08/11/15  Yes Fayrene Helper, PA-C  sulfamethoxazole-trimethoprim (BACTRIM DS,SEPTRA DS) 800-160 MG tablet Take 1 tablet by mouth 2 (two) times daily. 08/11/15 08/18/15 Yes Fayrene Helper, PA-C  traMADol (ULTRAM) 50 MG tablet Take 1 tablet (50 mg total) by mouth every 6 (six) hours as needed. Patient taking differently: Take 50 mg by mouth 2 (two) times daily as needed for moderate pain.  08/11/15  Yes Fayrene Helper, PA-C   Physical Exam: Filed Vitals:   08/13/15 1701 08/13/15 2341  BP: 117/67 115/54  Pulse: 103 67  Temp: 98 F (36.7 C) 99.1 F (37.3 C)  TempSrc:  Oral  Resp: 18 18  Height:  (1.803 m)   Weight: 104.327 kg (230 lb)   SpO2: 100% 99%    Wt Readings from Last 3 Encounters:  08/13/15 104.327 kg (230 lb)  08/11/15 104.327 kg (230 lb)  06/16/15 104.327 kg (230 lb)    General:  Appears In  acute distress from pain. Eyes: Positive photophobia. Right conjunctiva is injected. ENT: grossly normal hearing, positive right nostril abscess, lips & tongue Neck: Positive right cervical chain lymphadenopathy. No masses or thyromegaly Cardiovascular: RRR, no m/r/g. No LE edema. Telemetry: SR, no arrhythmias  Respiratory: CTA bilaterally, no w/r/r.  Normal respiratory effort. Abdomen: Obese, soft, ntnd Skin: no rash or induration seen on limited exam Musculoskeletal: grossly normal tone BUE/BLE Psychiatric: grossly normal mood and affect, speech fluent and appropriate Neurologic: Awake, alert, oriented 4, grossly non-focal.          Labs on Admission:  Basic Metabolic Panel:  Recent Labs Lab 08/13/15 1920  NA 138  K 4.1  CL 99*  GLUCOSE 81  BUN 8  CREATININE 1.40*   CBC:  Recent Labs Lab 08/13/15 1915 08/13/15 1920  WBC 9.0  --   NEUTROABS 6.4  --   HGB 12.6* 15.3  HCT 39.4 45.0  MCV 68.3*  --   PLT 250  --     Radiological Exams on Admission: Ct Maxillofacial W/cm  08/13/2015  CLINICAL DATA:  Abscess of the right nare EXAM: CT MAXILLOFACIAL WITH CONTRAST TECHNIQUE: Multidetector CT imaging of the maxillofacial structures was performed with intravenous contrast. Multiplanar CT image reconstructions were also generated. A small metallic BB was placed on the right temple in order to reliably differentiate right from left. CONTRAST:  100mL ISOVUE-300 IOPAMIDOL (ISOVUE-300) INJECTION 61% COMPARISON:  12/28/2010 FINDINGS: The paranasal sinuses are clear. Diffuse skin thickening and subcutaneous fat stranding along the right side of face noted compatible with cellulitis. More focal, asymmetric skin thickening involves the right nostril. Within this area of skin thickening there is focal low-attenuation compatible with a small abscess. This measures 6 x 5 x 8 mm. No bone erosions identified to suggest osteomyelitis. There has been previous screw and plate fixation of the right side of mandible, image 18 of series 3. The visualized intracranial contents are unremarkable. IMPRESSION: 1. Right facial cellulitis 2. Asymmetric skin thickening overlying the right nostril with central low attenuation compatible with small soft tissue abscess and/or phlegmon phlegmon. Electronically Signed   By: Signa Kellaylor  Stroud M.D.   On: 08/13/2015 21:16         Assessment/Plan Principal Problem:   Facial cellulitis   Nasal abscess Admit to MedSurg/inpatient. Nasal abscess has been drained by the ED. Analgesics as needed. Given the extensive cellulitis that failed outpatient treatment to Bactrim, I will start the patient on vancomycin per pharmacy dosing.  Active Problems:     Tobacco use disorder Nicotine replacement therapy ordered.   Addendum: I was notified by the emergency room provider that the patient apparently has left the facility. He has a left IV catheter placed in his LUE.    Code Status: Full code. DVT Prophylaxis: Lovenox SQ.  Family Communication: Patient's significant other was in the room. Disposition Plan: Admit for IV antibiotic therapy for a few days.  Time spent: About 70 minutes were spent during the process of this admission including direct contact with the patient, reviewing the chart, taken report and discussing treatment with the ED staff, admission orders and documentation.  Bobette Moavid Manuel Ortiz, M.D. [facility] Pager [phone].

## 2015-08-14 NOTE — Progress Notes (Signed)
Pharmacy Antibiotic Note  Phillip Jacobs is a 30 y.o. male admitted on 08/13/2015 with cellulitis.  Pharmacy has been consulted for vancomycin dosing.  Plan: Vancomycin 1000 IV every 8 hours.  Goal trough 10-15 mcg/mL.  Height: 5\' 11"  (180.3 cm) Weight: 230 lb (104.327 kg) IBW/kg (Calculated) : 75.3  Temp (24hrs), Avg:98.6 F (37 C), Min:98 F (36.7 C), Max:99.1 F (37.3 C)   Recent Labs Lab 08/13/15 1915 08/13/15 1920  WBC 9.0  --   CREATININE  --  1.40*    Estimated Creatinine Clearance: 95.7 mL/min (by C-G formula based on Cr of 1.4).    No Known Allergies   Thank you for allowing pharmacy to be a part of this patient's care.  Vernard GamblesVeronda Laithan Conchas, PharmD, BCPS  08/14/2015 2:53 AM

## 2015-08-26 NOTE — Discharge Summary (Signed)
Physician Discharge Summary  Phillip Jacobs ZOX:[id number] DOB: 07/28/1985 DOA: 08/13/2015  PCP: No PCP Per Patient  Admit date: 08/13/2015 Discharge date: 08/26/2015  Time spent: less than 30 minutes  Recommendations for Outpatient Follow-up:  1. None. The patient left from the emergency department shortly  after being admitted.    Discharge Diagnoses:  Principal Problem:   Facial cellulitis Active Problems:   Nasal abscess   Tobacco use disorder   Discharge Condition: The patient left the facility prior to being reevaluated  Diet recommendation: The patient left the facility prior to being reevaluated  [facility]   08/13/15 1701  Weight: 104.327 kg (230 lb)    History of present illness:  Phillip RodneyBryant M Rheaume is a 30 y.o. male with no previous past medical history who returns to the emergency department 2 days after being seen for similar symptoms which did not respond to oral antibiotics.  Per patient, about 5 days ago he started developing pain on his right nostril. Subsequently he developed an abscess at the entrance of the right nostril which caused significant pain and obstruction of airflow. This subsequently worsened to the point where the patient has developed significant left facial swelling, left anterior cervical chain adenopathy is, palpebral and periorbital swelling, right eye photophobia and significant tenderness. These symptoms have not been resolved by oral Bactrim. The patient left from the emergency department shortly  after being admitted.  Hospital Course:  The patient left the facility prior to being reevaluated.  Procedures:  I&D of nasal abscess     Discharge Exam: Filed Vitals:   08/13/15 1701 08/13/15 2341  BP: 117/67 115/54  Pulse: 103 67  Temp: 98 F (36.7 C) 99.1 F (37.3 C)  Resp: 18 18    General: The patient left from the emergency department shortly  after being admitted. Cardiovascular:  Respiratory:   Discharge  Instructions The patient left from the emergency department shortly  after being admitted.   Discharge Medication List as of 08/14/2015  2:59 AM  The patient left from the emergency department shortly  after being admitted. No medication recommendations were                          No Known Allergies    The results of significant diagnostics from this hospitalization (including imaging, microbiology, ancillary and laboratory) are listed below for reference.    Significant Diagnostic Studies: Ct Maxillofacial W/cm  08/13/2015  CLINICAL DATA:  Abscess of the right nare EXAM: CT MAXILLOFACIAL WITH CONTRAST TECHNIQUE: Multidetector CT imaging of the maxillofacial structures was performed with intravenous contrast. Multiplanar CT image reconstructions were also generated. A small metallic BB was placed on the right temple in order to reliably differentiate right from left. CONTRAST:  100mL ISOVUE-300 IOPAMIDOL (ISOVUE-300) INJECTION 61% COMPARISON:  12/28/2010 FINDINGS: The paranasal sinuses are clear. Diffuse skin thickening and subcutaneous fat stranding along the right side of face noted compatible with cellulitis. More focal, asymmetric skin thickening involves the right nostril. Within this area of skin thickening there is focal low-attenuation compatible with a small abscess. This measures 6 x 5 x 8 mm. No bone erosions identified to suggest osteomyelitis. There has been previous screw and plate fixation of the right side of mandible, image 18 of series 3. The visualized intracranial contents are unremarkable. IMPRESSION: 1. Right facial cellulitis 2. Asymmetric skin thickening overlying the right nostril with central low attenuation compatible with small soft tissue abscess and/or phlegmon phlegmon.  Electronically Signed   By: Signa Kell M.D.   On: 08/13/2015 21:16    Signed:  Bobette Mo MD.  [facility] 08/26/2015, 10:00 PM

## 2018-07-03 ENCOUNTER — Other Ambulatory Visit: Payer: Self-pay

## 2018-07-03 ENCOUNTER — Ambulatory Visit (HOSPITAL_COMMUNITY)
Admission: EM | Admit: 2018-07-03 | Discharge: 2018-07-03 | Disposition: A | Discharge status: Home / Self Care | Payer: Self-pay | Attending: Family Medicine | Admitting: Family Medicine

## 2018-07-03 ENCOUNTER — Encounter (HOSPITAL_COMMUNITY): Payer: Self-pay | Admitting: Emergency Medicine

## 2018-07-03 DIAGNOSIS — Z202 Contact with and (suspected) exposure to infections with a predominantly sexual mode of transmission: Secondary | ICD-10-CM | POA: Insufficient documentation

## 2018-07-03 MED ORDER — METRONIDAZOLE 500 MG PO TABS
ORAL_TABLET | ORAL | Status: AC
  Filled 2018-07-03: qty 4

## 2018-07-03 MED ORDER — METRONIDAZOLE 500 MG PO TABS
2000.0000 mg | ORAL_TABLET | Freq: Once | ORAL | Status: AC
  Administered 2018-07-03: 2000 mg via ORAL

## 2018-07-03 NOTE — ED Triage Notes (Signed)
PT was told by a partner she was positive for trichomonas. No symptoms.

## 2018-07-03 NOTE — Discharge Instructions (Addendum)
We treated you today for trichomonas We are sending your urine for testing and will call you with any positive results

## 2018-07-03 NOTE — ED Provider Notes (Signed)
[facility]    CSN: [id number] Arrival date & time: 07/03/18  1352     History   Chief Complaint Chief Complaint  Patient presents with  . Exposure to STD    HPI Phillip Jacobs is a 33 y.o. male.   Pt is a 33 year old male that presents for STD screening. He was exposed to trichomonas. He denies any symptoms.      History reviewed. No pertinent past medical history.  Patient Active Problem List   Diagnosis Date Noted  . Nasal abscess 08/14/2015  . Tobacco use disorder 08/14/2015  . Facial cellulitis 08/13/2015    Past Surgical History:  Procedure Laterality Date  . MANDIBLE FRACTURE SURGERY         Home Medications    Prior to Admission medications   Medication Sig Start Date End Date Taking? Authorizing Provider  mupirocin nasal ointment (BACTROBAN) 2 % Apply in each nostril daily x 1 week 08/11/15   Fayrene Helper, PA-C    Family History No family history on file.  Social History Social History   Tobacco Use  . Smoking status: Current Every Day Smoker    Packs/day: 1.50    Types: Cigarettes  Substance Use Topics  . Alcohol use: Yes    Alcohol/week: 4.0 standard drinks    Types: 4 Shots of liquor per week  . Drug use: Yes    Types: Marijuana     Allergies   Patient has no known allergies.   Review of Systems Review of Systems  Genitourinary: Negative for decreased urine volume, difficulty urinating, discharge, dysuria, enuresis, flank pain, frequency, genital sores, hematuria, penile pain, penile swelling, scrotal swelling, testicular pain and urgency.     Physical Exam Triage Vital Signs ED Triage Vitals  Enc Vitals Group     BP 07/03/18 1436 125/73     Pulse Rate 07/03/18 1436 71     Resp 07/03/18 1436 16     Temp 07/03/18 1436 97.9 F (36.6 C)     Temp Source 07/03/18 1436 Oral     SpO2 07/03/18 1436 100 %     Weight --      Height --      Head Circumference --      Peak Flow --      Pain Score 07/03/18 1435 0       Pain Loc --      Pain Edu? --      Excl. in GC? --    No data found.  Updated Vital Signs BP 125/73   Pulse 71   Temp 97.9 F (36.6 C) (Oral)   Resp 16   SpO2 100%   Visual Acuity Right Eye Distance:   Left Eye Distance:   Bilateral Distance:    Right Eye Near:   Left Eye Near:    Bilateral Near:     Physical Exam Vitals signs and nursing note reviewed.  Constitutional:      General: He is not in acute distress.    Appearance: Normal appearance. He is well-developed. He is not ill-appearing or toxic-appearing.  HENT:     Head: Normocephalic and atraumatic.     Mouth/Throat:     Pharynx: Oropharynx is clear.  Eyes:     Conjunctiva/sclera: Conjunctivae normal.  Cardiovascular:     Heart sounds: No murmur.  Pulmonary:     Effort: Pulmonary effort is normal.  Abdominal:     Palpations: Abdomen is soft.  Tenderness: There is no abdominal tenderness.  Musculoskeletal: Normal range of motion.  Skin:    General: Skin is warm and dry.  Neurological:     Mental Status: He is alert.  Psychiatric:        Mood and Affect: Mood normal.      UC Treatments / Results  Labs (all labs ordered are listed, but only abnormal results are displayed) Labs Reviewed  URINE CYTOLOGY ANCILLARY ONLY    EKG None  Radiology No results found.  Procedures Procedures (including critical care time)  Medications Ordered in UC Medications  metroNIDAZOLE (FLAGYL) tablet 2,000 mg (2,000 mg Oral Given 07/03/18 1509)    Initial Impression / Assessment and Plan / UC Course  I have reviewed the triage vital signs and the nursing notes.  Pertinent labs & imaging results that were available during my care of the patient were reviewed by me and considered in my medical decision making (see chart for details).     STD screening done today here in the clinic.  Treated for trichomoniasis here in the clinic.  Lab results pending.  Final Clinical Impressions(s) / UC Diagnoses    Final diagnoses:  STD exposure     Discharge Instructions     We treated you today for trichomonas We are sending your urine for testing and will call you with any positive results    ED Prescriptions    None     Controlled Substance Prescriptions [place] Controlled Substance Registry consulted? Not Applicable   Janace Aris, NP 07/03/18 1514

## 2018-07-04 LAB — URINE CYTOLOGY ANCILLARY ONLY
Chlamydia: NEGATIVE
NEISSERIA GONORRHEA: NEGATIVE
Trichomonas: NEGATIVE

## 2018-09-12 ENCOUNTER — Ambulatory Visit (HOSPITAL_COMMUNITY)
Admission: EM | Admit: 2018-09-12 | Discharge: 2018-09-12 | Disposition: A | Discharge status: Home / Self Care | Payer: Self-pay

## 2018-09-12 ENCOUNTER — Other Ambulatory Visit: Payer: Self-pay

## 2018-09-12 DIAGNOSIS — Z0289 Encounter for other administrative examinations: Secondary | ICD-10-CM

## 2018-09-12 DIAGNOSIS — Z7689 Persons encountering health services in other specified circumstances: Secondary | ICD-10-CM

## 2018-09-12 NOTE — Discharge Instructions (Addendum)
I feel that it is safe for you to return to work.  No concern for COVID 19 Follow up as needed for continued or worsening symptoms

## 2018-09-12 NOTE — ED Provider Notes (Signed)
[facility]    CSN: [id number] Arrival date & time: 09/12/18  1052     History   Chief Complaint Chief Complaint  Patient presents with  . Follow-up    HPI Weyman RodneyBryant M Shull is a 33 y.o. male.   Pt is a 33 year old male that presents needing note to return to work. He reports that he ate something bad and woke up Monday morning vomiting. He vomited 2 times and felt better. He believes that he ate meat that sat out all night. He did not take anything for the symptoms. He missed work. He has been eating and drinking since and feeling better all week. He wants to go back to work. He denies any associated fevers, chills, body aches, cough, congestion, diarrhea or abdominal discomfort.  Denies any recent sick contacts or recent traveling.  ROS per HPI      No past medical history on file.  Patient Active Problem List   Diagnosis Date Noted  . Nasal abscess 08/14/2015  . Tobacco use disorder 08/14/2015  . Facial cellulitis 08/13/2015    Past Surgical History:  Procedure Laterality Date  . MANDIBLE FRACTURE SURGERY         Home Medications    Prior to Admission medications   Medication Sig Start Date End Date Taking? Authorizing Provider  mupirocin nasal ointment (BACTROBAN) 2 % Apply in each nostril daily x 1 week 08/11/15   Fayrene Helperran, [place], PA-C    Family History No family history on file.  Social History Social History   Tobacco Use  . Smoking status: Current Every Day Smoker    Packs/day: 1.50    Types: Cigarettes  Substance Use Topics  . Alcohol use: Yes    Alcohol/week: 4.0 standard drinks    Types: 4 Shots of liquor per week  . Drug use: Yes    Types: Marijuana     Allergies   Patient has no known allergies.   Review of Systems Review of Systems   Physical Exam Triage Vital Signs ED Triage Vitals [09/12/18 1102]  Enc Vitals Group     BP 120/74     Pulse Rate 77     Resp 16     Temp 97.7 F (36.5 C)     Temp Source Oral   SpO2 99 %     Weight      Height      Head Circumference      Peak Flow      Pain Score 0     Pain Loc      Pain Edu?      Excl. in GC?    No data found.  Updated Vital Signs BP 120/74   Pulse 77   Temp 97.7 F (36.5 C) (Oral)   Resp 16   SpO2 99%   Visual Acuity Right Eye Distance:   Left Eye Distance:   Bilateral Distance:    Right Eye Near:   Left Eye Near:    Bilateral Near:     Physical Exam Vitals signs and nursing note reviewed.  Constitutional:      General: He is not in acute distress.    Appearance: Normal appearance. He is not ill-appearing, toxic-appearing or diaphoretic.  HENT:     Head: Normocephalic and atraumatic.     Nose: Nose normal.  Eyes:     Conjunctiva/sclera: Conjunctivae normal.  Neck:     Musculoskeletal: Normal range of motion.  Pulmonary:  Effort: Pulmonary effort is normal.  Abdominal:     Palpations: Abdomen is soft.     Tenderness: There is no abdominal tenderness.  Musculoskeletal: Normal range of motion.  Skin:    General: Skin is warm and dry.  Neurological:     Mental Status: He is alert.  Psychiatric:        Mood and Affect: Mood normal.      UC Treatments / Results  Labs (all labs ordered are listed, but only abnormal results are displayed) Labs Reviewed - No data to display  EKG None  Radiology No results found.  Procedures Procedures (including critical care time)  Medications Ordered in UC Medications - No data to display  Initial Impression / Assessment and Plan / UC Course  I have reviewed the triage vital signs and the nursing notes.  Pertinent labs & imaging results that were available during my care of the patient were reviewed by me and considered in my medical decision making (see chart for details).     Exam normal  Safe to return to work.   Final Clinical Impressions(s) / UC Diagnoses   Final diagnoses:  Return to work evaluation     Discharge Instructions     I feel that  it is safe for you to return to work.  No concern for COVID 19 Follow up as needed for continued or worsening symptoms     ED Prescriptions    None     Controlled Substance Prescriptions [place] Controlled Substance Registry consulted? Not Applicable   Janace Aris, NP 09/12/18 1400
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
## 2020-01-18 ENCOUNTER — Emergency Department (HOSPITAL_COMMUNITY): Payer: Self-pay

## 2020-01-18 ENCOUNTER — Encounter (HOSPITAL_COMMUNITY): Payer: Self-pay | Admitting: Emergency Medicine

## 2020-01-18 ENCOUNTER — Observation Stay (HOSPITAL_COMMUNITY)
Admission: EM | Admit: 2020-01-18 | Discharge: 2020-01-19 | Disposition: A | Discharge status: Home / Self Care | Payer: Self-pay | Attending: Physician Assistant | Admitting: Physician Assistant

## 2020-01-18 DIAGNOSIS — Z23 Encounter for immunization: Secondary | ICD-10-CM | POA: Insufficient documentation

## 2020-01-18 DIAGNOSIS — S01411A Laceration without foreign body of right cheek and temporomandibular area, initial encounter: Principal | ICD-10-CM | POA: Insufficient documentation

## 2020-01-18 DIAGNOSIS — Y92007 Garden or yard of unspecified non-institutional (private) residence as the place of occurrence of the external cause: Secondary | ICD-10-CM | POA: Insufficient documentation

## 2020-01-18 DIAGNOSIS — Z20822 Contact with and (suspected) exposure to covid-19: Secondary | ICD-10-CM | POA: Insufficient documentation

## 2020-01-18 DIAGNOSIS — S01512A Laceration without foreign body of oral cavity, initial encounter: Secondary | ICD-10-CM | POA: Insufficient documentation

## 2020-01-18 DIAGNOSIS — Z0189 Encounter for other specified special examinations: Secondary | ICD-10-CM

## 2020-01-18 DIAGNOSIS — S0181XA Laceration without foreign body of other part of head, initial encounter: Secondary | ICD-10-CM

## 2020-01-18 DIAGNOSIS — Y9389 Activity, other specified: Secondary | ICD-10-CM | POA: Insufficient documentation

## 2020-01-18 DIAGNOSIS — T07XXXA Unspecified multiple injuries, initial encounter: Secondary | ICD-10-CM

## 2020-01-18 LAB — CBC WITH DIFFERENTIAL/PLATELET
Abs Immature Granulocytes: 0.04 10*3/uL (ref 0.00–0.07)
Basophils Absolute: 0 10*3/uL (ref 0.0–0.1)
Basophils Relative: 0 %
Eosinophils Absolute: 0.1 10*3/uL (ref 0.0–0.5)
Eosinophils Relative: 2 %
HCT: 39.2 % (ref 39.0–52.0)
Hemoglobin: 12.3 g/dL — ABNORMAL LOW (ref 13.0–17.0)
Immature Granulocytes: 1 %
Lymphocytes Relative: 36 %
Lymphs Abs: 3.2 10*3/uL (ref 0.7–4.0)
MCH: 22.3 pg — ABNORMAL LOW (ref 26.0–34.0)
MCHC: 31.4 g/dL (ref 30.0–36.0)
MCV: 71.1 fL — ABNORMAL LOW (ref 80.0–100.0)
Monocytes Absolute: 1 10*3/uL (ref 0.1–1.0)
Monocytes Relative: 11 %
Neutro Abs: 4.5 10*3/uL (ref 1.7–7.7)
Neutrophils Relative %: 50 %
Platelets: 363 10*3/uL (ref 150–400)
RBC: 5.51 MIL/uL (ref 4.22–5.81)
RDW: 16.5 % — ABNORMAL HIGH (ref 11.5–15.5)
WBC: 8.9 10*3/uL (ref 4.0–10.5)
nRBC: 0 % (ref 0.0–0.2)

## 2020-01-18 LAB — COMPREHENSIVE METABOLIC PANEL
ALT: 14 U/L (ref 0–44)
AST: 19 U/L (ref 15–41)
Albumin: 4 g/dL (ref 3.5–5.0)
Alkaline Phosphatase: 65 U/L (ref 38–126)
Anion gap: 13 (ref 5–15)
BUN: 9 mg/dL (ref 6–20)
CO2: 24 mmol/L (ref 22–32)
Calcium: 9.4 mg/dL (ref 8.9–10.3)
Chloride: 99 mmol/L (ref 98–111)
Creatinine, Ser: 1.15 mg/dL (ref 0.61–1.24)
GFR calc Af Amer: >60 mL/min (ref >60)
GFR calc non Af Amer: >60 mL/min (ref >60)
Glucose, Bld: 173 mg/dL — ABNORMAL HIGH (ref 70–99)
Potassium: 3 mmol/L — ABNORMAL LOW (ref 3.5–5.1)
Sodium: 136 mmol/L (ref 135–145)
Total Bilirubin: 0.7 mg/dL (ref 0.3–1.2)
Total Protein: 7.2 g/dL (ref 6.5–8.1)

## 2020-01-18 LAB — I-STAT CHEM 8, ED
BUN: 9 mg/dL (ref 6–20)
Calcium, Ion: 1.1 mmol/L — ABNORMAL LOW (ref 1.15–1.40)
Chloride: 98 mmol/L (ref 98–111)
Creatinine, Ser: 1 mg/dL (ref 0.61–1.24)
Glucose, Bld: 168 mg/dL — ABNORMAL HIGH (ref 70–99)
HCT: 42 % (ref 39.0–52.0)
Hemoglobin: 14.3 g/dL (ref 13.0–17.0)
Potassium: 2.9 mmol/L — ABNORMAL LOW (ref 3.5–5.1)
Sodium: 136 mmol/L (ref 135–145)
TCO2: 24 mmol/L (ref 22–32)

## 2020-01-18 LAB — PROTIME-INR
INR: 1.1 (ref 0.8–1.2)
Prothrombin Time: 13.7 seconds (ref 11.4–15.2)

## 2020-01-18 LAB — ETHANOL: Alcohol, Ethyl (B): <10 mg/dL (ref <10)

## 2020-01-18 LAB — SAMPLE TO BLOOD BANK

## 2020-01-18 LAB — LACTIC ACID, PLASMA: Lactic Acid, Venous: 2.3 mmol/L (ref 0.5–1.9)

## 2020-01-18 MED ORDER — IOHEXOL 300 MG/ML  SOLN
75.0000 mL | Freq: Once | INTRAMUSCULAR | Status: AC | PRN
  Administered 2020-01-18: 75 mL via INTRAVENOUS

## 2020-01-18 MED ORDER — LACTATED RINGERS IV BOLUS
1000.0000 mL | Freq: Once | INTRAVENOUS | Status: AC
  Administered 2020-01-18: 1000 mL via INTRAVENOUS

## 2020-01-18 MED ORDER — CEFAZOLIN SODIUM-DEXTROSE 2-4 GM/100ML-% IV SOLN
2.0000 g | Freq: Once | INTRAVENOUS | Status: AC
  Administered 2020-01-18: 2 g via INTRAVENOUS

## 2020-01-18 MED ORDER — FENTANYL CITRATE (PF) 100 MCG/2ML IJ SOLN
INTRAMUSCULAR | Status: AC
  Filled 2020-01-18: qty 2

## 2020-01-18 MED ORDER — TETANUS-DIPHTH-ACELL PERTUSSIS 5-2.5-18.5 LF-MCG/0.5 IM SUSP
0.5000 mL | Freq: Once | INTRAMUSCULAR | Status: AC
  Administered 2020-01-18: 0.5 mL via INTRAMUSCULAR

## 2020-01-18 MED ORDER — TRANEXAMIC ACID FOR EPISTAXIS
500.0000 mg | Freq: Once | TOPICAL | Status: AC
  Administered 2020-01-18: 500 mg via TOPICAL
  Filled 2020-01-18: qty 10

## 2020-01-18 MED ORDER — FENTANYL CITRATE (PF) 100 MCG/2ML IJ SOLN
50.0000 ug | Freq: Once | INTRAMUSCULAR | Status: AC
  Administered 2020-01-18: 50 ug via INTRAVENOUS

## 2020-01-18 MED ORDER — TRANEXAMIC ACID FOR EPISTAXIS: 500.0000 mg | Freq: Once | TOPICAL | Status: DC
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
## 2020-01-18 NOTE — ED Notes (Signed)
Patient waiting call from OR . Family at bedside .
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
## 2020-01-19 ENCOUNTER — Encounter (HOSPITAL_COMMUNITY)
Admission: EM | Disposition: A | Discharge status: Home / Self Care | Payer: Self-pay | Source: Home / Self Care | Attending: Emergency Medicine

## 2020-01-19 ENCOUNTER — Emergency Department (HOSPITAL_COMMUNITY): Payer: Self-pay | Admitting: Anesthesiology

## 2020-01-19 ENCOUNTER — Emergency Department (HOSPITAL_COMMUNITY): Payer: Self-pay

## 2020-01-19 DIAGNOSIS — S01411A Laceration without foreign body of right cheek and temporomandibular area, initial encounter: Secondary | ICD-10-CM

## 2020-01-19 DIAGNOSIS — S01512A Laceration without foreign body of oral cavity, initial encounter: Secondary | ICD-10-CM

## 2020-01-19 DIAGNOSIS — S01511A Laceration without foreign body of lip, initial encounter: Secondary | ICD-10-CM

## 2020-01-19 DIAGNOSIS — T07XXXA Unspecified multiple injuries, initial encounter: Secondary | ICD-10-CM | POA: Diagnosis present

## 2020-01-19 HISTORY — PX: FACIAL LACERATION REPAIR: SHX6589

## 2020-01-19 LAB — CBC
HCT: 28.7 % — ABNORMAL LOW (ref 39.0–52.0)
HCT: 26.7 % — ABNORMAL LOW (ref 39.0–52.0)
Hemoglobin: 9.2 g/dL — ABNORMAL LOW (ref 13.0–17.0)
Hemoglobin: 8.4 g/dL — ABNORMAL LOW (ref 13.0–17.0)
MCH: 22.5 pg — ABNORMAL LOW (ref 26.0–34.0)
MCH: 21.9 pg — ABNORMAL LOW (ref 26.0–34.0)
MCHC: 32.1 g/dL (ref 30.0–36.0)
MCHC: 31.5 g/dL (ref 30.0–36.0)
MCV: 70.2 fL — ABNORMAL LOW (ref 80.0–100.0)
MCV: 69.7 fL — ABNORMAL LOW (ref 80.0–100.0)
Platelets: 257 10*3/uL (ref 150–400)
Platelets: 251 10*3/uL (ref 150–400)
RBC: 4.09 MIL/uL — ABNORMAL LOW (ref 4.22–5.81)
RBC: 3.83 MIL/uL — ABNORMAL LOW (ref 4.22–5.81)
RDW: 15.8 % — ABNORMAL HIGH (ref 11.5–15.5)
RDW: 15.7 % — ABNORMAL HIGH (ref 11.5–15.5)
WBC: 12.4 10*3/uL — ABNORMAL HIGH (ref 4.0–10.5)
WBC: 11.3 10*3/uL — ABNORMAL HIGH (ref 4.0–10.5)
nRBC: 0 % (ref 0.0–0.2)
nRBC: 0 % (ref 0.0–0.2)

## 2020-01-19 LAB — POCT I-STAT, CHEM 8
BUN: 9 mg/dL (ref 6–20)
Calcium, Ion: 1.21 mmol/L (ref 1.15–1.40)
Chloride: 97 mmol/L — ABNORMAL LOW (ref 98–111)
Creatinine, Ser: 0.9 mg/dL (ref 0.61–1.24)
Glucose, Bld: 127 mg/dL — ABNORMAL HIGH (ref 70–99)
HCT: 35 % — ABNORMAL LOW (ref 39.0–52.0)
Hemoglobin: 11.9 g/dL — ABNORMAL LOW (ref 13.0–17.0)
Potassium: 4.1 mmol/L (ref 3.5–5.1)
Sodium: 137 mmol/L (ref 135–145)
TCO2: 29 mmol/L (ref 22–32)

## 2020-01-19 LAB — BASIC METABOLIC PANEL
Anion gap: 10 (ref 5–15)
BUN: 8 mg/dL (ref 6–20)
CO2: 25 mmol/L (ref 22–32)
Calcium: 8.9 mg/dL (ref 8.9–10.3)
Chloride: 100 mmol/L (ref 98–111)
Creatinine, Ser: 1.03 mg/dL (ref 0.61–1.24)
GFR calc Af Amer: >60 mL/min (ref >60)
GFR calc non Af Amer: >60 mL/min (ref >60)
Glucose, Bld: 163 mg/dL — ABNORMAL HIGH (ref 70–99)
Potassium: 4.4 mmol/L (ref 3.5–5.1)
Sodium: 135 mmol/L (ref 135–145)

## 2020-01-19 LAB — HIV ANTIBODY (ROUTINE TESTING W REFLEX): HIV Screen 4th Generation wRfx: NONREACTIVE

## 2020-01-19 LAB — SARS CORONAVIRUS 2 BY RT PCR (HOSPITAL ORDER, PERFORMED IN ~~LOC~~ HOSPITAL LAB): SARS Coronavirus 2: NEGATIVE

## 2020-01-19 SURGERY — REPAIR, LACERATION, FACE
Anesthesia: General | Site: Face | Laterality: Right

## 2020-01-19 MED ORDER — OXYCODONE HCL 5 MG PO TABS: 5.0000 mg | ORAL_TABLET | ORAL | Status: DC | PRN

## 2020-01-19 MED ORDER — VASOPRESSIN 20 UNIT/ML IV SOLN
INTRAVENOUS | Status: AC
  Filled 2020-01-19: qty 1

## 2020-01-19 MED ORDER — PANTOPRAZOLE SODIUM 40 MG IV SOLR: 40.0000 mg | Freq: Every day | INTRAVENOUS | Status: DC

## 2020-01-19 MED ORDER — ONDANSETRON HCL 4 MG/2ML IJ SOLN: 4.0000 mg | Freq: Once | INTRAMUSCULAR | Status: DC | PRN

## 2020-01-19 MED ORDER — CLINDAMYCIN PHOSPHATE 300 MG/50ML IV SOLN
300.0000 mg | Freq: Four times a day (QID) | INTRAVENOUS | Status: DC
  Administered 2020-01-19 (×2): 300 mg via INTRAVENOUS
  Filled 2020-01-19 (×5): qty 50

## 2020-01-19 MED ORDER — LIDOCAINE 2% (20 MG/ML) 5 ML SYRINGE
INTRAMUSCULAR | Status: AC
  Filled 2020-01-19: qty 5

## 2020-01-19 MED ORDER — ONDANSETRON 4 MG PO TBDP: 4.0000 mg | ORAL_TABLET | Freq: Four times a day (QID) | ORAL | Status: DC | PRN

## 2020-01-19 MED ORDER — MULTIPLE VITAMINS-IRON PO TABS: 1.0000 | ORAL_TABLET | Freq: Every day | ORAL | 0 refills | Status: AC

## 2020-01-19 MED ORDER — LIDOCAINE-EPINEPHRINE 1 %-1:100000 IJ SOLN
INTRAMUSCULAR | Status: AC
  Filled 2020-01-19: qty 1

## 2020-01-19 MED ORDER — FENTANYL CITRATE (PF) 250 MCG/5ML IJ SOLN
INTRAMUSCULAR | Status: DC | PRN
Start: 2020-01-19 — End: 2020-01-19
  Administered 2020-01-19 (×3): 50 ug via INTRAVENOUS

## 2020-01-19 MED ORDER — LIDOCAINE HCL (CARDIAC) PF 100 MG/5ML IV SOSY
PREFILLED_SYRINGE | INTRAVENOUS | Status: DC | PRN
  Administered 2020-01-19: 60 mg via INTRAVENOUS

## 2020-01-19 MED ORDER — CLINDAMYCIN PHOSPHATE 900 MG/50ML IV SOLN
INTRAVENOUS | Status: AC
  Filled 2020-01-19: qty 50

## 2020-01-19 MED ORDER — SUGAMMADEX SODIUM 200 MG/2ML IV SOLN
INTRAVENOUS | Status: DC | PRN
  Administered 2020-01-19: 200 mg via INTRAVENOUS

## 2020-01-19 MED ORDER — CLINDAMYCIN HCL 300 MG PO CAPS: 300.0000 mg | ORAL_CAPSULE | Freq: Three times a day (TID) | ORAL | 0 refills | Status: AC

## 2020-01-19 MED ORDER — MIDAZOLAM HCL 2 MG/2ML IJ SOLN: 2.0000 mg | Freq: Once | INTRAMUSCULAR | Status: DC

## 2020-01-19 MED ORDER — EPINEPHRINE HCL (NASAL) 0.1 % NA SOLN
NASAL | Status: DC | PRN
  Administered 2020-01-19: 30 mL via TOPICAL

## 2020-01-19 MED ORDER — FENTANYL CITRATE (PF) 100 MCG/2ML IJ SOLN
INTRAMUSCULAR | Status: AC
  Filled 2020-01-19: qty 2

## 2020-01-19 MED ORDER — KCL IN DEXTROSE-NACL 20-5-0.45 MEQ/L-%-% IV SOLN
INTRAVENOUS | Status: DC
  Filled 2020-01-19: qty 1000

## 2020-01-19 MED ORDER — EPINEPHRINE HCL (NASAL) 0.1 % NA SOLN
NASAL | Status: AC
  Filled 2020-01-19: qty 30

## 2020-01-19 MED ORDER — STERILE WATER FOR IRRIGATION IR SOLN
Status: DC | PRN
  Administered 2020-01-19: 1000 mL

## 2020-01-19 MED ORDER — 0.9 % SODIUM CHLORIDE (POUR BTL) OPTIME
TOPICAL | Status: DC | PRN
  Administered 2020-01-19: 1000 mL

## 2020-01-19 MED ORDER — PHENYLEPHRINE HCL (PRESSORS) 10 MG/ML IV SOLN
INTRAVENOUS | Status: DC | PRN
  Administered 2020-01-19 (×2): 80 ug via INTRAVENOUS

## 2020-01-19 MED ORDER — ROCURONIUM 10MG/ML (10ML) SYRINGE FOR MEDFUSION PUMP - OPTIME
INTRAVENOUS | Status: DC | PRN
  Administered 2020-01-19: 60 mg via INTRAVENOUS

## 2020-01-19 MED ORDER — OXYCODONE HCL 5 MG PO TABS
5.0000 mg | ORAL_TABLET | Freq: Four times a day (QID) | ORAL | 0 refills | Status: AC | PRN
Start: 2020-01-19 — End: ?

## 2020-01-19 MED ORDER — PROPOFOL 10 MG/ML IV BOLUS
INTRAVENOUS | Status: AC
  Filled 2020-01-19: qty 20

## 2020-01-19 MED ORDER — ALBUMIN HUMAN 5 % IV SOLN: INTRAVENOUS | Status: DC | PRN

## 2020-01-19 MED ORDER — ACETAMINOPHEN 325 MG PO TABS: 650.0000 mg | ORAL_TABLET | ORAL | Status: DC | PRN

## 2020-01-19 MED ORDER — SUCCINYLCHOLINE CHLORIDE 200 MG/10ML IV SOSY
PREFILLED_SYRINGE | INTRAVENOUS | Status: AC
  Filled 2020-01-19: qty 10

## 2020-01-19 MED ORDER — CLINDAMYCIN PHOSPHATE 900 MG/50ML IV SOLN
INTRAVENOUS | Status: DC | PRN
  Administered 2020-01-19: 900 mg via INTRAVENOUS

## 2020-01-19 MED ORDER — MUPIROCIN CALCIUM 2 % EX CREA
TOPICAL_CREAM | CUTANEOUS | Status: AC
  Filled 2020-01-19: qty 15

## 2020-01-19 MED ORDER — FENTANYL CITRATE (PF) 100 MCG/2ML IJ SOLN
25.0000 ug | INTRAMUSCULAR | Status: DC | PRN
  Administered 2020-01-19: 25 ug via INTRAVENOUS

## 2020-01-19 MED ORDER — LIDOCAINE HCL 2 % IJ SOLN
INTRAMUSCULAR | Status: AC
  Filled 2020-01-19: qty 20

## 2020-01-19 MED ORDER — MIDAZOLAM HCL 2 MG/2ML IJ SOLN
INTRAMUSCULAR | Status: DC | PRN
  Administered 2020-01-19: 2 mg via INTRAVENOUS

## 2020-01-19 MED ORDER — PANTOPRAZOLE SODIUM 40 MG PO TBEC
40.0000 mg | DELAYED_RELEASE_TABLET | Freq: Every day | ORAL | Status: DC
  Administered 2020-01-19: 40 mg via ORAL
  Filled 2020-01-19: qty 1

## 2020-01-19 MED ORDER — ONDANSETRON HCL 4 MG/2ML IJ SOLN: 4.0000 mg | Freq: Four times a day (QID) | INTRAMUSCULAR | Status: DC | PRN

## 2020-01-19 MED ORDER — SUCCINYLCHOLINE 20MG/ML (10ML) SYRINGE FOR MEDFUSION PUMP - OPTIME
INTRAMUSCULAR | Status: DC | PRN
  Administered 2020-01-19: 200 mg via INTRAVENOUS

## 2020-01-19 MED ORDER — FENTANYL CITRATE (PF) 250 MCG/5ML IJ SOLN
INTRAMUSCULAR | Status: AC
  Filled 2020-01-19: qty 5

## 2020-01-19 MED ORDER — MIDAZOLAM HCL 2 MG/2ML IJ SOLN
INTRAMUSCULAR | Status: AC
  Filled 2020-01-19: qty 2

## 2020-01-19 MED ORDER — KCL IN DEXTROSE-NACL 20-5-0.45 MEQ/L-%-% IV SOLN
INTRAVENOUS | Status: AC
  Filled 2020-01-19: qty 1000

## 2020-01-19 MED ORDER — DOUBLE ANTIBIOTIC 500-10000 UNIT/GM EX OINT: TOPICAL_OINTMENT | CUTANEOUS | 0 refills | Status: AC

## 2020-01-19 MED ORDER — LACTATED RINGERS IV SOLN: INTRAVENOUS | Status: DC | PRN

## 2020-01-19 MED ORDER — MUPIROCIN 2 % EX OINT
TOPICAL_OINTMENT | CUTANEOUS | Status: AC
  Filled 2020-01-19: qty 22

## 2020-01-19 MED ORDER — OXYCODONE HCL 5 MG PO TABS
10.0000 mg | ORAL_TABLET | ORAL | Status: DC | PRN
  Administered 2020-01-19: 10 mg via ORAL
  Filled 2020-01-19: qty 2

## 2020-01-19 MED ORDER — ONDANSETRON HCL 4 MG/2ML IJ SOLN
INTRAMUSCULAR | Status: DC | PRN
  Administered 2020-01-19: 4 mg via INTRAVENOUS

## 2020-01-19 MED ORDER — MORPHINE SULFATE (PF) 2 MG/ML IV SOLN
1.0000 mg | INTRAVENOUS | Status: DC | PRN
  Administered 2020-01-19: 2 mg via INTRAVENOUS
  Filled 2020-01-19: qty 1

## 2020-01-19 MED ORDER — DEXAMETHASONE SODIUM PHOSPHATE 10 MG/ML IJ SOLN
INTRAMUSCULAR | Status: DC | PRN
  Administered 2020-01-19: 10 mg via INTRAVENOUS

## 2020-01-19 MED ORDER — ACETAMINOPHEN 325 MG PO TABS: 650.0000 mg | ORAL_TABLET | ORAL | Status: AC | PRN

## 2020-01-19 MED ORDER — PROPOFOL 10 MG/ML IV BOLUS
INTRAVENOUS | Status: DC | PRN
  Administered 2020-01-19: 200 mg via INTRAVENOUS
  Administered 2020-01-19: 100 mg via INTRAVENOUS

## 2020-01-19 MED ORDER — LIDOCAINE-EPINEPHRINE 1 %-1:100000 IJ SOLN
INTRAMUSCULAR | Status: DC | PRN
  Administered 2020-01-19: 10 mL

## 2020-01-19 SURGICAL SUPPLY — 36 items
CANISTER SUCT 3000ML PPV (MISCELLANEOUS) ×3 IMPLANT
CLEANER TIP ELECTROSURG 2X2 (MISCELLANEOUS) ×3 IMPLANT
COVER SURGICAL LIGHT HANDLE (MISCELLANEOUS) ×3 IMPLANT
COVER WAND RF STERILE (DRAPES) ×3 IMPLANT
ELECT REM PT RETURN 9FT ADLT (ELECTROSURGICAL) ×3
ELECTRODE REM PT RTRN 9FT ADLT (ELECTROSURGICAL) ×1 IMPLANT
FORCEPS BIPOLAR SPETZLER 8 1.0 (NEUROSURGERY SUPPLIES) ×3 IMPLANT
GAUZE 4X4 16PLY RFD (DISPOSABLE) ×9 IMPLANT
GLOVE ECLIPSE 7.5 STRL STRAW (GLOVE) ×6 IMPLANT
GLOVE SURG SS PI 6.5 STRL IVOR (GLOVE) ×6 IMPLANT
GOWN STRL REUS W/ TWL LRG LVL3 (GOWN DISPOSABLE) ×2 IMPLANT
GOWN STRL REUS W/ TWL XL LVL3 (GOWN DISPOSABLE) ×1 IMPLANT
GOWN STRL REUS W/TWL LRG LVL3 (GOWN DISPOSABLE) ×6
GOWN STRL REUS W/TWL XL LVL3 (GOWN DISPOSABLE) ×3
KIT BASIN OR (CUSTOM PROCEDURE TRAY) ×3 IMPLANT
KIT TURNOVER KIT B (KITS) ×3 IMPLANT
NS IRRIG 1000ML POUR BTL (IV SOLUTION) ×3 IMPLANT
PAD ARMBOARD 7.5X6 YLW CONV (MISCELLANEOUS) ×6 IMPLANT
PENCIL FOOT CONTROL (ELECTRODE) ×3 IMPLANT
POSITIONER HEAD DONUT 9IN (MISCELLANEOUS) ×3 IMPLANT
SUT CHROMIC 3 0 PS 2 (SUTURE) ×3 IMPLANT
SUT CHROMIC 4 0 P 3 18 (SUTURE) IMPLANT
SUT CHROMIC 4 0 RB 1X27 (SUTURE) ×6 IMPLANT
SUT ETHILON 6 0 P 1 (SUTURE) ×9 IMPLANT
SUT SILK 4 0 (SUTURE) ×3
SUT SILK 4-0 18XBRD TIE 12 (SUTURE) ×1 IMPLANT
SUT VIC AB 4-0 PS2 18 (SUTURE) ×3 IMPLANT
SUT VIC AB 4-0 RB1 27 (SUTURE) ×3
SUT VIC AB 4-0 RB1 27X BRD (SUTURE) ×1 IMPLANT
SUT VIC AB 5-0 P-3 18X BRD (SUTURE) ×2 IMPLANT
SUT VIC AB 5-0 P3 18 (SUTURE) ×6
SYR BULB IRRIG 60ML STRL (SYRINGE) ×3 IMPLANT
TOWEL GREEN STERILE FF (TOWEL DISPOSABLE) ×3 IMPLANT
TRAY ENT MC OR (CUSTOM PROCEDURE TRAY) ×3 IMPLANT
WATER STERILE IRR 1000ML POUR (IV SOLUTION) ×3 IMPLANT
YANKAUER SUCT BULB TIP NO VENT (SUCTIONS) ×3 IMPLANT
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
## 2020-01-19 NOTE — Plan of Care (Signed)
Pt given D/C instructions with verbal understanding. Pt's IV was removed prior to D/C. Pt's mouth dressing was changed by PA prior to D/C. Pt's incision are clean and dry with no sign of infection. Pt's IV was removed prior to D/C. Pt D/C'd home via wheelchair per MD order. Pt is stable @ D/C and has no other needs at this time. Rema Fendt, RN

## 2020-01-19 NOTE — Interval H&P Note (Signed)
History and Physical Interval Note:  01/19/2020 12:40 AM  Phillip Jacobs  has presented today for surgery, with the diagnosis of right Cheek Complex Laceration.  The various methods of treatment have been discussed with the patient and family. After consideration of risks, benefits and other options for treatment, the patient has consented to  Procedure(s): REPAIR CHEEK LACERATION (Right) as a surgical intervention.  The patient's history has been reviewed, patient examined, no change in status, stable for surgery.  I have reviewed the patient's chart and labs.  Questions were answered to the patient's satisfaction.     Dillard Cannon

## 2020-01-19 NOTE — Brief Op Note (Signed)
01/19/2020  4:09 AM  PATIENT:  Phillip Jacobs  34 y.o. male  PRE-OPERATIVE DIAGNOSIS:  Right Cheek Complex Laceration  POST-OPERATIVE DIAGNOSIS:  Right Cheek Complex Laceration  PROCEDURE:  Procedure(s): COMPLEX REPAIR OF RIGHT CHEEK LACERATION (Right)  10 cm  And intraoral laceration 5 cm  SURGEON:  Surgeon(s) and Role:    Drema Halon, MD - Primary  PHYSICIAN ASSISTANT:   ASSISTANTS: none   ANESTHESIA:   general  EBL:  Per anesthesia record  >150 cc intraoperatively  BLOOD ADMINISTERED:none  DRAINS: none   LOCAL MEDICATIONS USED:  XYLOCAINE   SPECIMEN:  No Specimen  DISPOSITION OF SPECIMEN:  N/A  COUNTS:  YES  TOURNIQUET:  * No tourniquets in log *  DICTATION: .Other Dictation: Dictation Number [id number]  PLAN OF CARE: Admit for overnight observation  PATIENT DISPOSITION:  PACU - hemodynamically stable.   Delay start of Pharmacological VTE agent (>24hrs) due to surgical blood loss or risk of bleeding: yes
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
## 2020-01-19 NOTE — ED Notes (Signed)
Transported to OR

## 2020-01-19 NOTE — Discharge Instructions (Signed)
Keep incision site clean and apply antibiotic ointment daily Take antibiotics as prescribed Return to see Dr Ezzard Standing next Tuesday at 4:30 Call office if any questions or problems   [phone]

## 2020-01-19 NOTE — Progress Notes (Signed)
Went to the waiting area to locate family.  There was no family waiting for patient.

## 2020-01-19 NOTE — Transfer of Care (Signed)
Immediate Anesthesia Transfer of Care Note  Patient: Phillip Jacobs  Procedure(s) Performed: COMPLEX REPAIR OF RIGHT CHEEK LACERATION (Right Face)  Patient Location: PACU  Anesthesia Type:General  Level of Consciousness: sedated  Airway & Oxygen Therapy: Patient Spontanous Breathing  Post-op Assessment: Report given to RN and Post -op Vital signs reviewed and stable  Post vital signs: Reviewed and stable  Last Vitals:  Vitals Value Taken Time  BP    Temp    Pulse 105 01/19/20 0350  Resp 15 01/19/20 0350  SpO2 96 % 01/19/20 0350  Vitals shown include unvalidated device data.  Last Pain:  Vitals:   01/18/20 2345  TempSrc:   PainSc: 0-No pain         Complications: No complications documented.
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
## 2020-01-19 NOTE — Anesthesia Procedure Notes (Signed)
Procedure Name: Intubation Date/Time: 01/19/2020 12:52 AM Performed by: Molli Hazard, CRNA Pre-anesthesia Checklist: Patient identified, Emergency Drugs available, Suction available and Patient being monitored Patient Re-evaluated:Patient Re-evaluated prior to induction Oxygen Delivery Method: Circle system utilized Preoxygenation: Pre-oxygenation with 100% oxygen Induction Type: IV induction and Rapid sequence Laryngoscope Size: Glidescope and 4 Grade View: Grade I Tube size: 7.5 mm Number of attempts: 1 Airway Equipment and Method: Stylet Placement Confirmation: ETT inserted through vocal cords under direct vision,  positive ETCO2 and breath sounds checked- equal and bilateral Secured at: 23 cm Tube secured with: Tape Dental Injury: Teeth and Oropharynx as per pre-operative assessment  Comments: With sedation, patient's airway assessed with Glidescope. As the airway appeared uncompromised, general anesthesia was induced. Gr 1 view with Glidescope, easy intubation, but cuff of ETT tore. Bougie stylet placed and ETT exchanged with visual confirmation by Glidescope.

## 2020-01-19 NOTE — Progress Notes (Signed)
POST OP CHECK Patient sleeping with no airway problems. He's had no significant bleeding intraorally or externally No blood in cannister from NG. H/H dropped from 11.9/35 pre op to 9.2/28.7 post op check. No further bleeding noted. NG can be removed and patient can be discharged later today or in am He has moderate swelling of right cheek. When discharged would recommend cleocin 300 mg tid for 1 week and multivit with iron and application of antibiotic ointment to incision site daily Follow up with Dr Ezzard Standing next Tuesday
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
## 2020-01-19 NOTE — Anesthesia Preprocedure Evaluation (Signed)
Anesthesia Evaluation  Patient identified by MRN, date of birth, ID band Patient awake    Reviewed: Allergy & Precautions, NPO status , Patient's Chart, lab work & pertinent test results  Airway       Comment: Severe through and through laceration of R cheek with extension into mouth. Gauze packing in mouth with active bleeding and large amount of clot within the mouth. Moderate edema of R. Side of face with no submandibular or anterior neck edema. Full ROM of neck with adequate mouth opening. Teeth intact, no injury to tongue. Dental  (+) Teeth Intact   Pulmonary    breath sounds clear to auscultation       Cardiovascular  Rhythm:Regular Rate:Normal     Neuro/Psych    GI/Hepatic   Endo/Other    Renal/GU      Musculoskeletal   Abdominal   Peds  Hematology   Anesthesia Other Findings   Reproductive/Obstetrics                             Anesthesia Physical Anesthesia Plan  ASA: IV and emergent  Anesthesia Plan: General   Post-op Pain Management:    Induction: Intravenous, Cricoid pressure planned and Rapid sequence  PONV Risk Score and Plan: Ondansetron and Dexamethasone  Airway Management Planned: Oral ETT and Video Laryngoscope Planned  Additional Equipment:   Intra-op Plan:   Post-operative Plan: Possible Post-op intubation/ventilation  Informed Consent: I have reviewed the patients History and Physical, chart, labs and discussed the procedure including the risks, benefits and alternatives for the proposed anesthesia with the patient or authorized representative who has indicated his/her understanding and acceptance.     Dental advisory given  Plan Discussed with: Anesthesiologist and CRNA  Anesthesia Plan Comments: (Plan awake look with glide scope prior to induction)        Anesthesia Quick Evaluation

## 2020-01-20 ENCOUNTER — Encounter (HOSPITAL_COMMUNITY): Payer: Self-pay | Admitting: Otolaryngology

## 2020-01-20 ENCOUNTER — Encounter (HOSPITAL_COMMUNITY): Payer: Self-pay | Admitting: Emergency Medicine

## 2020-01-20 NOTE — Anesthesia Postprocedure Evaluation (Signed)
Anesthesia Post Note  Patient: Phillip Jacobs  Procedure(s) Performed: COMPLEX REPAIR OF RIGHT CHEEK LACERATION (Right Face)     Patient location during evaluation: PACU Anesthesia Type: General Level of consciousness: awake and alert Pain management: pain level controlled Vital Signs Assessment: post-procedure vital signs reviewed and stable Respiratory status: spontaneous breathing, nonlabored ventilation, respiratory function stable and patient connected to nasal cannula oxygen Cardiovascular status: blood pressure returned to baseline and stable Postop Assessment: no apparent nausea or vomiting Anesthetic complications: no   No complications documented.  Last Vitals:  Vitals:   01/19/20 1153 01/19/20 1620  BP: (!) 157/99 126/77  Pulse: 79 80  Resp: 20 17  Temp: 37.1 C 36.8 C  SpO2: 100% 98%    Last Pain:  Vitals:   01/19/20 1620  TempSrc: Oral  PainSc:                  Tyrin Herbers COKER

## 2020-01-22 ENCOUNTER — Telehealth (INDEPENDENT_AMBULATORY_CARE_PROVIDER_SITE_OTHER): Payer: Self-pay

## 2020-01-26 ENCOUNTER — Encounter (INDEPENDENT_AMBULATORY_CARE_PROVIDER_SITE_OTHER): Payer: Self-pay | Admitting: Otolaryngology

## 2020-01-26 ENCOUNTER — Ambulatory Visit (INDEPENDENT_AMBULATORY_CARE_PROVIDER_SITE_OTHER): Payer: Self-pay | Admitting: Otolaryngology

## 2020-01-26 ENCOUNTER — Other Ambulatory Visit: Payer: Self-pay

## 2020-01-26 VITALS — Temp 97.2°F

## 2020-01-26 DIAGNOSIS — Z4889 Encounter for other specified surgical aftercare: Secondary | ICD-10-CM
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
## 2020-02-03 ENCOUNTER — Ambulatory Visit (INDEPENDENT_AMBULATORY_CARE_PROVIDER_SITE_OTHER): Payer: Self-pay | Admitting: Otolaryngology

## 2020-02-03 ENCOUNTER — Other Ambulatory Visit: Payer: Self-pay

## 2020-02-03 VITALS — Temp 97.3°F

## 2020-02-03 DIAGNOSIS — Z4889 Encounter for other specified surgical aftercare: Secondary | ICD-10-CM
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
## 2022-04-22 IMAGING — DX DG ABDOMEN 1V
1 series · 1 of 1 positions shown · non-contrast
Comparison: None.

CLINICAL DATA: Nasogastric tube placement

EXAM:
ABDOMEN - 1 VIEW

[abdomen kub]
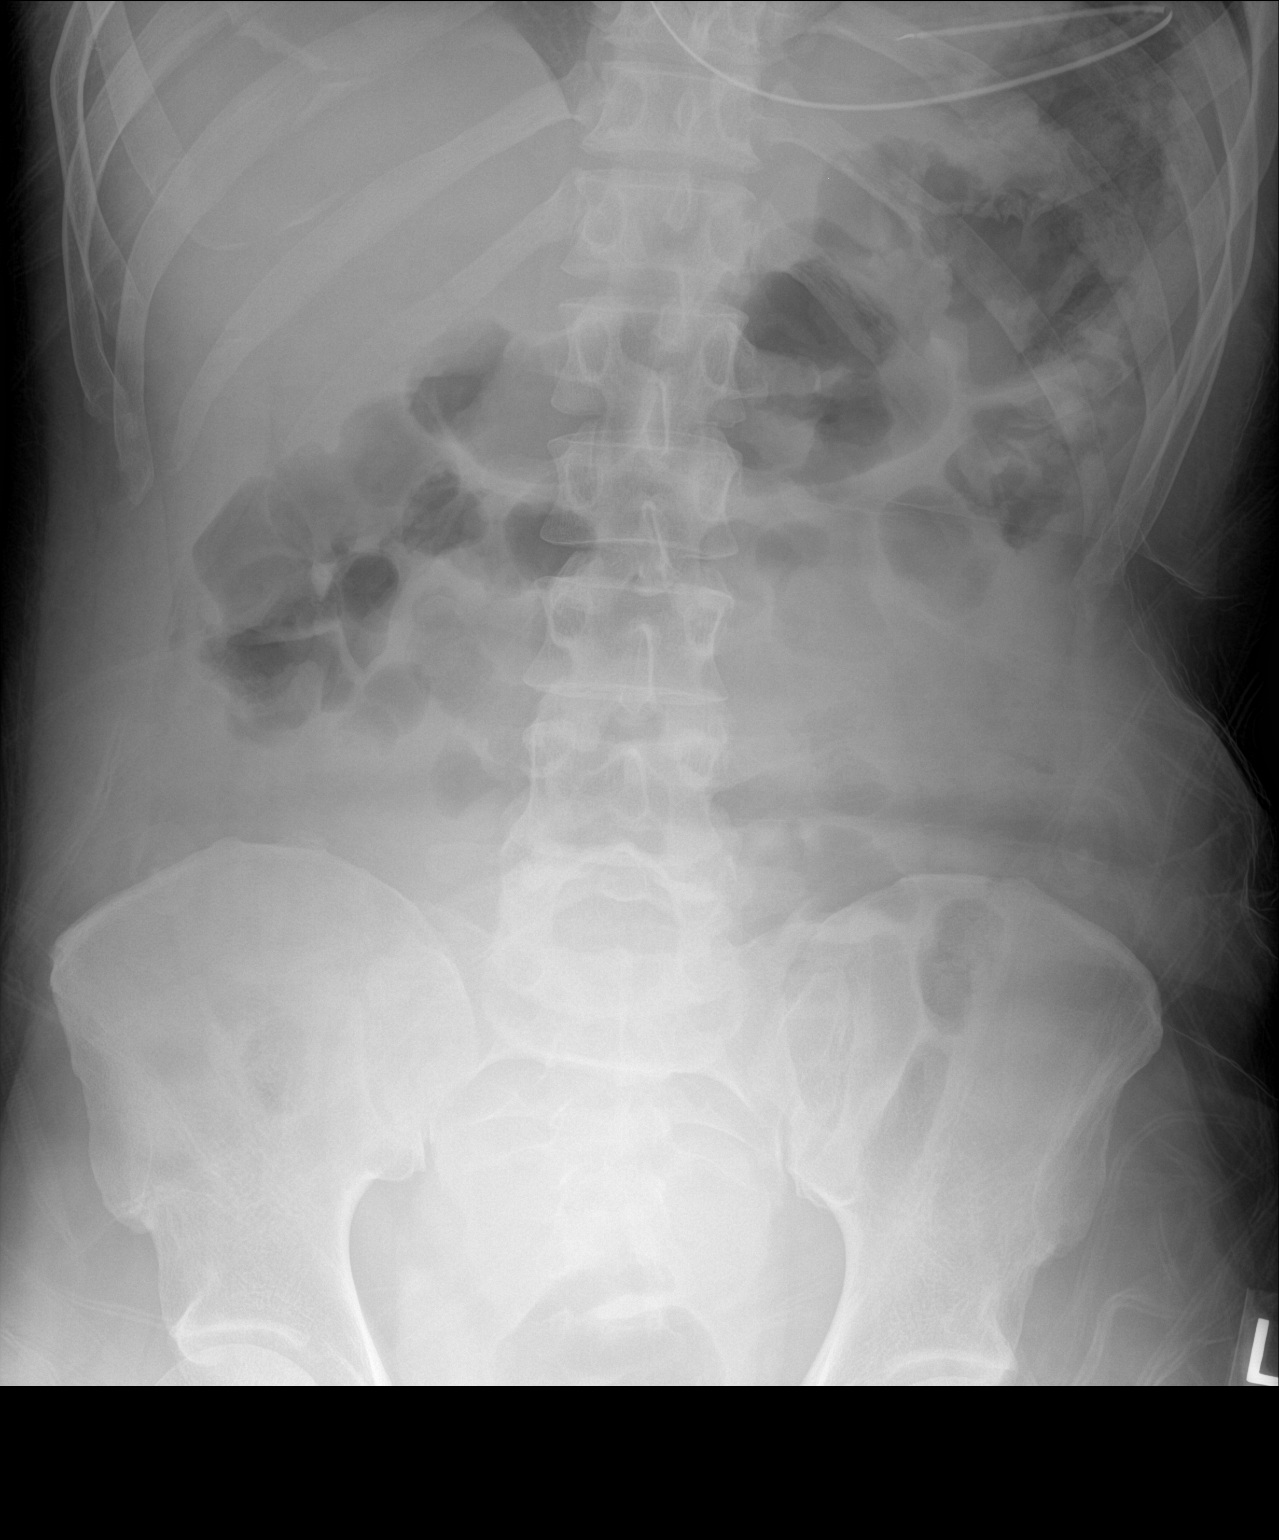

[1 of 1 positions shown; findings below may reference images not displayed]

FINDINGS: Enteric tube with tip and side port at the upper stomach. Gas is
seen within transverse colon and small bowel loops primarily. No
concerning mass effect or calcification.
IMPRESSION: Nasogastric tube in expected position.

## 2022-07-09 ENCOUNTER — Emergency Department (HOSPITAL_COMMUNITY)
Admission: EM | Admit: 2022-07-09 | Discharge: 2022-07-09 | Payer: Self-pay | Attending: Emergency Medicine | Admitting: Emergency Medicine

## 2022-07-09 ENCOUNTER — Emergency Department (HOSPITAL_COMMUNITY): Payer: Self-pay

## 2022-07-09 ENCOUNTER — Encounter (HOSPITAL_COMMUNITY): Payer: Self-pay | Admitting: Emergency Medicine

## 2022-07-09 DIAGNOSIS — Z4800 Encounter for change or removal of nonsurgical wound dressing: Secondary | ICD-10-CM | POA: Insufficient documentation

## 2022-07-09 DIAGNOSIS — M795 Residual foreign body in soft tissue: Secondary | ICD-10-CM | POA: Insufficient documentation

## 2022-07-09 DIAGNOSIS — L03116 Cellulitis of left lower limb: Secondary | ICD-10-CM | POA: Insufficient documentation

## 2022-07-09 DIAGNOSIS — W3400XA Accidental discharge from unspecified firearms or gun, initial encounter: Secondary | ICD-10-CM

## 2022-07-09 DIAGNOSIS — Z189 Retained foreign body fragments, unspecified material: Secondary | ICD-10-CM

## 2022-07-09 DIAGNOSIS — Z23 Encounter for immunization: Secondary | ICD-10-CM | POA: Insufficient documentation

## 2022-07-09 MED ORDER — TETANUS-DIPHTH-ACELL PERTUSSIS 5-2.5-18.5 LF-MCG/0.5 IM SUSY
0.5000 mL | PREFILLED_SYRINGE | Freq: Once | INTRAMUSCULAR | Status: AC
  Administered 2022-07-09: 0.5 mL via INTRAMUSCULAR
  Filled 2022-07-09: qty 0.5

## 2022-07-09 MED ORDER — CEFAZOLIN SODIUM-DEXTROSE 2-4 GM/100ML-% IV SOLN
2.0000 g | Freq: Once | INTRAVENOUS | Status: AC
  Administered 2022-07-09: 2 g via INTRAVENOUS
  Filled 2022-07-09: qty 100

## 2022-07-09 MED ORDER — CEPHALEXIN 500 MG PO CAPS: 500.0000 mg | ORAL_CAPSULE | Freq: Four times a day (QID) | ORAL | 0 refills | Status: AC

## 2022-07-09 MED ORDER — KETOROLAC TROMETHAMINE 30 MG/ML IJ SOLN
30.0000 mg | Freq: Once | INTRAMUSCULAR | Status: AC
  Administered 2022-07-09: 30 mg via INTRAVENOUS
  Filled 2022-07-09: qty 1

## 2022-07-09 MED ORDER — IBUPROFEN 600 MG PO TABS: 600.0000 mg | ORAL_TABLET | Freq: Four times a day (QID) | ORAL | 0 refills | Status: AC | PRN
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
## 2024-02-03 NOTE — Telephone Encounter (Signed)
 Phillip Jacobs phoned into office around 3:00pm. He stated that he needed pain medication because he has only 4 pills left and he is in pain. Per Dr. Ethyl pt needs to take Talynol for pain because he did not Rx any pain meds. Pt needs to call ER back if he needs more.
